# Patient Record
Sex: Female | Born: 1955 | Race: White | Hispanic: No | Marital: Married | Smoking: Never smoker
Health system: Southern US, Community
[De-identification: ages and names within clinical notes are randomized; demographics above are authoritative.]

## PROBLEM LIST (undated history)

## (undated) DIAGNOSIS — M1990 Unspecified osteoarthritis, unspecified site: Secondary | ICD-10-CM

## (undated) DIAGNOSIS — K219 Gastro-esophageal reflux disease without esophagitis: Secondary | ICD-10-CM

## (undated) DIAGNOSIS — M353 Polymyalgia rheumatica: Secondary | ICD-10-CM

## (undated) DIAGNOSIS — E7800 Pure hypercholesterolemia, unspecified: Secondary | ICD-10-CM

## (undated) DIAGNOSIS — G4730 Sleep apnea, unspecified: Secondary | ICD-10-CM

## (undated) DIAGNOSIS — E079 Disorder of thyroid, unspecified: Secondary | ICD-10-CM

## (undated) DIAGNOSIS — E039 Hypothyroidism, unspecified: Secondary | ICD-10-CM

## (undated) DIAGNOSIS — I10 Essential (primary) hypertension: Secondary | ICD-10-CM

## (undated) DIAGNOSIS — M1120 Other chondrocalcinosis, unspecified site: Secondary | ICD-10-CM

## (undated) DIAGNOSIS — B019 Varicella without complication: Secondary | ICD-10-CM

## (undated) DIAGNOSIS — T7840XA Allergy, unspecified, initial encounter: Secondary | ICD-10-CM

## (undated) DIAGNOSIS — B2790 Infectious mononucleosis, unspecified without complication: Secondary | ICD-10-CM

## (undated) HISTORY — PX: COLONOSCOPY: SHX174

## (undated) HISTORY — DX: Gastro-esophageal reflux disease without esophagitis: K21.9

## (undated) HISTORY — PX: JOINT REPLACEMENT: SHX530

## (undated) HISTORY — PX: KNEE SURGERY: SHX244

## (undated) HISTORY — PX: OVARIAN CYST SURGERY: SHX726

## (undated) HISTORY — DX: Disorder of thyroid, unspecified: E07.9

## (undated) HISTORY — DX: Allergy, unspecified, initial encounter: T78.40XA

---

## 1981-11-28 HISTORY — PX: ILIOTIBIAL BAND RELEASE: SHX675

## 1981-11-28 HISTORY — PX: OVARIAN CYST SURGERY: SHX726

## 1990-11-28 HISTORY — PX: OSTEOTOMY: SHX137

## 2002-11-28 HISTORY — PX: REPLACEMENT TOTAL KNEE: SUR1224

## 2002-12-29 HISTORY — PX: ABDOMINAL HYSTERECTOMY: SHX81

## 2002-12-29 HISTORY — PX: VAGINAL HYSTERECTOMY: SHX2639

## 2004-11-08 ENCOUNTER — Inpatient Hospital Stay: Payer: Self-pay | Admitting: General Practice

## 2004-11-08 HISTORY — PX: TOTAL KNEE ARTHROPLASTY: SHX125

## 2004-11-19 ENCOUNTER — Ambulatory Visit: Payer: Self-pay | Admitting: Family Medicine

## 2004-12-01 ENCOUNTER — Encounter: Payer: Self-pay | Admitting: General Practice

## 2004-12-01 ENCOUNTER — Emergency Department: Payer: Self-pay | Admitting: General Practice

## 2004-12-29 ENCOUNTER — Encounter: Payer: Self-pay | Admitting: General Practice

## 2005-01-26 ENCOUNTER — Encounter: Payer: Self-pay | Admitting: General Practice

## 2005-02-26 ENCOUNTER — Encounter: Payer: Self-pay | Admitting: General Practice

## 2006-11-09 ENCOUNTER — Ambulatory Visit: Payer: Self-pay | Admitting: Family Medicine

## 2008-02-14 ENCOUNTER — Ambulatory Visit: Payer: Self-pay | Admitting: Family Medicine

## 2008-05-23 ENCOUNTER — Ambulatory Visit: Payer: Self-pay | Admitting: Unknown Physician Specialty

## 2008-06-17 ENCOUNTER — Ambulatory Visit: Payer: Self-pay | Admitting: Unknown Physician Specialty

## 2008-06-20 ENCOUNTER — Ambulatory Visit: Payer: Self-pay | Admitting: Unknown Physician Specialty

## 2008-07-02 ENCOUNTER — Ambulatory Visit: Payer: Self-pay | Admitting: Unknown Physician Specialty

## 2009-01-21 ENCOUNTER — Ambulatory Visit: Payer: Self-pay | Admitting: Family Medicine

## 2009-06-25 ENCOUNTER — Ambulatory Visit: Payer: Self-pay | Admitting: Unknown Physician Specialty

## 2009-06-28 DIAGNOSIS — B2790 Infectious mononucleosis, unspecified without complication: Secondary | ICD-10-CM | POA: Insufficient documentation

## 2010-01-12 ENCOUNTER — Ambulatory Visit: Payer: Self-pay | Admitting: Unknown Physician Specialty

## 2011-08-18 ENCOUNTER — Ambulatory Visit: Payer: Self-pay | Admitting: Sports Medicine

## 2011-09-16 ENCOUNTER — Ambulatory Visit: Payer: Self-pay | Admitting: General Practice

## 2012-12-20 ENCOUNTER — Ambulatory Visit: Payer: Self-pay | Admitting: Unknown Physician Specialty

## 2013-09-16 ENCOUNTER — Ambulatory Visit: Payer: Self-pay | Admitting: Family Medicine

## 2013-10-11 ENCOUNTER — Ambulatory Visit: Payer: Self-pay | Admitting: Gastroenterology

## 2014-09-19 ENCOUNTER — Ambulatory Visit: Payer: Self-pay | Admitting: Family Medicine

## 2015-04-08 ENCOUNTER — Encounter: Payer: Self-pay | Admitting: Podiatry

## 2015-04-08 ENCOUNTER — Ambulatory Visit (INDEPENDENT_AMBULATORY_CARE_PROVIDER_SITE_OTHER): Payer: BLUE CROSS/BLUE SHIELD | Admitting: Podiatry

## 2015-04-08 VITALS — BP 134/89 | HR 93 | Resp 16 | Ht 66.0 in | Wt 205.0 lb

## 2015-04-08 DIAGNOSIS — M1180 Other specified crystal arthropathies, unspecified site: Secondary | ICD-10-CM | POA: Insufficient documentation

## 2015-04-08 DIAGNOSIS — B019 Varicella without complication: Secondary | ICD-10-CM | POA: Insufficient documentation

## 2015-04-08 DIAGNOSIS — M722 Plantar fascial fibromatosis: Secondary | ICD-10-CM | POA: Diagnosis not present

## 2015-04-08 DIAGNOSIS — M1990 Unspecified osteoarthritis, unspecified site: Secondary | ICD-10-CM | POA: Insufficient documentation

## 2015-04-08 HISTORY — DX: Varicella without complication: B01.9

## 2015-04-09 NOTE — Progress Notes (Signed)
She presents today for chief complaint of occasionally painful feet associated with her orthotics. She states that her orthotics are older and starting to breakdown causes pain. She denies any changes in her past medical history medications allergies surgeries or social history.  Objective: Vital signs are stable she is alert and oriented 3. Pulses are palpable bilateral. Orthopedic evaluationrectus foot type with mild tenderness on palpation of the Achilles and the plantar fashion bilateral otherwise no erythema or edema saline drainage or odor. All muscle groups are intact and neurologic sensorium is intact.  Assessment: Rectus foot type bilateral history of plantar fasciitis.  Plan: Scanned for new set of orthotics. These will help prevent fasciitis recurrence.

## 2015-05-11 ENCOUNTER — Telehealth: Payer: Self-pay | Admitting: Family Medicine

## 2015-05-11 DIAGNOSIS — E039 Hypothyroidism, unspecified: Secondary | ICD-10-CM

## 2015-05-11 NOTE — Telephone Encounter (Signed)
TSH for hypothyroid. Thanks.

## 2015-05-11 NOTE — Telephone Encounter (Signed)
Pt stated she needs a lab slip and would like to be called when it is ready to be picked up. Thanks TNP

## 2015-05-11 NOTE — Telephone Encounter (Signed)
Patient advised that lab slip is ready.

## 2015-05-13 ENCOUNTER — Telehealth: Payer: Self-pay

## 2015-05-13 NOTE — Telephone Encounter (Signed)
Called to let pt know that there orthotics were in and ready for pick up .

## 2015-05-14 LAB — TSH: TSH: 1.66 u[IU]/mL (ref 0.450–4.500)

## 2015-05-15 ENCOUNTER — Encounter: Payer: Self-pay | Admitting: Family Medicine

## 2015-05-15 ENCOUNTER — Ambulatory Visit (INDEPENDENT_AMBULATORY_CARE_PROVIDER_SITE_OTHER): Payer: BLUE CROSS/BLUE SHIELD | Admitting: Family Medicine

## 2015-05-15 VITALS — BP 112/66 | HR 91 | Temp 98.1°F | Resp 16 | Ht 66.0 in | Wt 210.0 lb

## 2015-05-15 DIAGNOSIS — M109 Gout, unspecified: Secondary | ICD-10-CM | POA: Insufficient documentation

## 2015-05-15 DIAGNOSIS — E069 Thyroiditis, unspecified: Secondary | ICD-10-CM | POA: Insufficient documentation

## 2015-05-15 DIAGNOSIS — K219 Gastro-esophageal reflux disease without esophagitis: Secondary | ICD-10-CM

## 2015-05-15 DIAGNOSIS — R5383 Other fatigue: Secondary | ICD-10-CM | POA: Diagnosis not present

## 2015-05-15 DIAGNOSIS — G4730 Sleep apnea, unspecified: Secondary | ICD-10-CM

## 2015-05-15 DIAGNOSIS — H9190 Unspecified hearing loss, unspecified ear: Secondary | ICD-10-CM | POA: Insufficient documentation

## 2015-05-15 DIAGNOSIS — R0789 Other chest pain: Secondary | ICD-10-CM | POA: Diagnosis not present

## 2015-05-15 DIAGNOSIS — L299 Pruritus, unspecified: Secondary | ICD-10-CM | POA: Insufficient documentation

## 2015-05-15 DIAGNOSIS — E282 Polycystic ovarian syndrome: Secondary | ICD-10-CM | POA: Insufficient documentation

## 2015-05-15 DIAGNOSIS — E7800 Pure hypercholesterolemia, unspecified: Secondary | ICD-10-CM | POA: Insufficient documentation

## 2015-05-15 DIAGNOSIS — E559 Vitamin D deficiency, unspecified: Secondary | ICD-10-CM

## 2015-05-15 DIAGNOSIS — L308 Other specified dermatitis: Secondary | ICD-10-CM | POA: Insufficient documentation

## 2015-05-15 DIAGNOSIS — M1990 Unspecified osteoarthritis, unspecified site: Secondary | ICD-10-CM | POA: Insufficient documentation

## 2015-05-15 DIAGNOSIS — N809 Endometriosis, unspecified: Secondary | ICD-10-CM | POA: Insufficient documentation

## 2015-05-15 DIAGNOSIS — E039 Hypothyroidism, unspecified: Secondary | ICD-10-CM | POA: Insufficient documentation

## 2015-05-15 NOTE — Progress Notes (Signed)
Subjective:    Patient ID: Alexis Newman, female    DOB: March 06, 1956, 59 y.o.   MRN: [id number]  Gastrophageal Reflux She complains of chest pain and coughing. She reports no abdominal pain, no choking, no heartburn or no sore throat. This is a chronic problem. The problem occurs constantly. The problem has been gradually worsening. The symptoms are aggravated by certain foods. Associated symptoms include fatigue. She has tried an antacid for the symptoms. The treatment provided mild relief.  Patient reports that she takes Prilosec for daily for 2 weeks and notices an improvement on symptoms. Patient reports that as soon as she stops the medication her symptoms worsen.   Fatigue Patient reports that she has been fatigued for months. Patient reports that she just came back from 3 weeks of vacation and still feels very tired. Is using CPAP regularly.  Wakes if does not take tylenol PM.  Has had night sweat also.    Review of Systems  Constitutional: Positive for fatigue.  HENT: Negative for sore throat.   Respiratory: Positive for cough. Negative for choking.   Cardiovascular: Positive for chest pain.  Gastrointestinal: Negative for heartburn and abdominal pain.   Patient Active Problem List   Diagnosis Date Noted  . Itch of skin 05/15/2015  . Endometriosis 05/15/2015  . Acid reflux 05/15/2015  . Gout 05/15/2015  . Difficulty hearing 05/15/2015  . Osteoarthrosis 05/15/2015  . Polycystic ovaries 05/15/2015  . Hypercholesterolemia without hypertriglyceridemia 05/15/2015  . Apnea, sleep 05/15/2015  . Thyroiditis 05/15/2015  . Avitaminosis D 05/15/2015  . Arthritis 04/08/2015  . Plantar fasciitis 04/08/2015  . Chicken pox 04/08/2015  . 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 Mononucleosis 06/28/2009   Past Medical History  Diagnosis Date  . Allergy   . Thyroid disease   . GERD (gastroesophageal reflux disease)    Current Outpatient Prescriptions on File  Prior to Visit  Medication Sig  . SYNTHROID 112 MCG tablet    No current facility-administered medications on file prior to visit.   No Known Allergies Past Surgical History  Procedure Laterality Date  . Replacement total knee Left 2004  . Knee surgery Bilateral     Multiple surgies bilaterally.   . Abdominal hysterectomy  12/2002    Still has cervix. Due to endometriosis  . Iliotibial band release  1983    Iliotibial extraaticcular reconstruction   . Osteotomy  1992    Partial osteotomy  . Ovarian cyst surgery  1978 & 1983   History   Social History  . Marital Status: Married    Spouse Name: Pieter Partridge  . Number of Children: 0  . Years of Education: Ph.D.   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 Smoking status: Never Smoker   . Smokeless tobacco: Never Used  . Alcohol Use: 1.2 oz/week    0 Standard drinks or equivalent, 2 Cans of beer per week     Comment: Occasionally  . Drug Use: No  . Sexual Activity: Not on file   Other Topics Concern  . Not on file   Social History Narrative   Family History  Problem Relation Age of Onset  . Diabetes Mother   . Glaucoma Mother   . Stroke Father   . Hypertension Father   . Skin cancer Father   . Dementia Father   . Healthy Sister   . Hypertension Brother   . Diabetes Brother   . Stroke  Maternal Grandfather         Objective:   Physical Exam  Constitutional: She is oriented to person, place, and time. She appears well-developed and well-nourished.  Cardiovascular: Normal rate and regular rhythm.   Pulmonary/Chest: Effort normal and breath sounds normal.  Neurological: She is alert and oriented to person, place, and time.  Psychiatric: She has a normal mood and affect. Her behavior is normal. Judgment and thought content normal.    Blood pressure 112/66, pulse 91, temperature 98.1 F (36.7 C), temperature source Oral, resp. rate 16, height 5\' 6"  (1.676 m), weight 210 lb  (95.255 kg), SpO2 97 %.       Assessment & Plan:   1. Gastroesophageal reflux disease, esophagitis presence not specified Worsening. Continue Prilosec. Will refer.  - Ambulatory referral to Gastroenterology  2. Other fatigue Unclear etiology.  Check labs.  - TSH - Vitamin B12 - Comprehensive metabolic panel  3. Avitaminosis D Taking meds, will check labs.  - Vit D  25 hydroxy (rtn osteoporosis monitoring)  4. Apnea, sleep Tolerating if takes  Tylenol PM. Will consider Auto titration if another source of fatigue can not be found.   5. Atypical chest pain Ekg WNL. Will refer.  - EKG 12-Lead - CBC with Differential/Platelet - Ambulatory referral to Cardiology  Lorie Phenix, MD

## 2015-05-19 LAB — CBC WITH DIFFERENTIAL/PLATELET
Basophils Absolute: 0 10*3/uL (ref 0.0–0.2)
Basos: 1 %
EOS (ABSOLUTE): 0.1 10*3/uL (ref 0.0–0.4)
EOS: 2 %
HEMATOCRIT: 38.4 % (ref 34.0–46.6)
HEMOGLOBIN: 12.8 g/dL (ref 11.1–15.9)
Immature Grans (Abs): 0 10*3/uL (ref 0.0–0.1)
Immature Granulocytes: 0 %
LYMPHS ABS: 1.8 10*3/uL (ref 0.7–3.1)
Lymphs: 23 %
MCH: 28.9 pg (ref 26.6–33.0)
MCHC: 33.3 g/dL (ref 31.5–35.7)
MCV: 87 fL (ref 79–97)
MONOS ABS: 0.5 10*3/uL (ref 0.1–0.9)
Monocytes: 7 %
NEUTROS ABS: 5.3 10*3/uL (ref 1.4–7.0)
Neutrophils: 67 %
Platelets: 269 10*3/uL (ref 150–379)
RBC: 4.43 x10E6/uL (ref 3.77–5.28)
RDW: 13.5 % (ref 12.3–15.4)
WBC: 7.8 10*3/uL (ref 3.4–10.8)

## 2015-05-19 LAB — VITAMIN D 25 HYDROXY (VIT D DEFICIENCY, FRACTURES): Vit D, 25-Hydroxy: 38.3 ng/mL (ref 30.0–100.0)

## 2015-05-19 LAB — COMPREHENSIVE METABOLIC PANEL
A/G RATIO: 1.7 (ref 1.1–2.5)
ALT: 30 IU/L (ref 0–32)
AST: 25 IU/L (ref 0–40)
Albumin: 4.3 g/dL (ref 3.5–5.5)
Alkaline Phosphatase: 67 IU/L (ref 39–117)
BILIRUBIN TOTAL: 0.2 mg/dL (ref 0.0–1.2)
BUN / CREAT RATIO: 20 (ref 9–23)
BUN: 15 mg/dL (ref 6–24)
CHLORIDE: 98 mmol/L (ref 97–108)
CO2: 24 mmol/L (ref 18–29)
Calcium: 9.4 mg/dL (ref 8.7–10.2)
Creatinine, Ser: 0.75 mg/dL (ref 0.57–1.00)
GFR calc Af Amer: 101 mL/min/{1.73_m2} (ref >59)
GFR, EST NON AFRICAN AMERICAN: 88 mL/min/{1.73_m2} (ref >59)
Globulin, Total: 2.6 g/dL (ref 1.5–4.5)
Glucose: 103 mg/dL — ABNORMAL HIGH (ref 65–99)
Potassium: 4.7 mmol/L (ref 3.5–5.2)
Sodium: 140 mmol/L (ref 134–144)
TOTAL PROTEIN: 6.9 g/dL (ref 6.0–8.5)

## 2015-05-19 LAB — TSH: TSH: 1.4 u[IU]/mL (ref 0.450–4.500)

## 2015-05-19 LAB — VITAMIN B12: Vitamin B-12: 391 pg/mL (ref 211–946)

## 2015-05-22 ENCOUNTER — Telehealth: Payer: Self-pay

## 2015-05-22 NOTE — Telephone Encounter (Signed)
Pt advised as directed below.  She says that we was not fasting for to the blood work, and that is probably why her sugar was up some.   Thanks,   -Vernona Rieger

## 2015-05-22 NOTE — Telephone Encounter (Signed)
-----   Message from Lorie Phenix, MD sent at 05/20/2015  8:50 PM EDT ----- Labs stable except blood sugar slightly higher than should be. Make sure to eat healthy and exercise and recheck in 6 months for stability. Please notify patient. Thanks.

## 2015-07-06 ENCOUNTER — Encounter (INDEPENDENT_AMBULATORY_CARE_PROVIDER_SITE_OTHER): Payer: Self-pay

## 2015-07-06 ENCOUNTER — Encounter: Payer: Self-pay | Admitting: Cardiovascular Disease

## 2015-07-06 ENCOUNTER — Ambulatory Visit (INDEPENDENT_AMBULATORY_CARE_PROVIDER_SITE_OTHER): Payer: BLUE CROSS/BLUE SHIELD | Admitting: Cardiovascular Disease

## 2015-07-06 VITALS — BP 158/92 | HR 71 | Ht 66.0 in | Wt 206.2 lb

## 2015-07-06 DIAGNOSIS — R002 Palpitations: Secondary | ICD-10-CM | POA: Insufficient documentation

## 2015-07-06 DIAGNOSIS — R5383 Other fatigue: Secondary | ICD-10-CM | POA: Insufficient documentation

## 2015-07-06 DIAGNOSIS — R5382 Chronic fatigue, unspecified: Secondary | ICD-10-CM | POA: Diagnosis not present

## 2015-07-06 DIAGNOSIS — G4730 Sleep apnea, unspecified: Secondary | ICD-10-CM

## 2015-07-06 DIAGNOSIS — R0602 Shortness of breath: Secondary | ICD-10-CM | POA: Diagnosis not present

## 2015-07-06 DIAGNOSIS — E7800 Pure hypercholesterolemia, unspecified: Secondary | ICD-10-CM

## 2015-07-06 NOTE — Patient Instructions (Addendum)
You are doing well. No medication changes were made.  We will order a CT coronary calcium score for fatigue Wednesday, Aug 10 @ 3:30 Please arrive @ 3:15 There is a one time fee of $150.00 due at the time of your procedure  Consider trazodone for sleep  Please call us if you have new issues that need to be addressed before your next appt.   CT Scan A computed tomography (CT) scan is a specialized X-ray scan. It uses X-rays and a computer to make pictures of different areas of your body. A CT scan can offer more detailed information than a regular X-ray exam. The CT scan provides data about internal organs, soft tissue structures, blood vessels, and bones.  The CT scanner is a large machine that takes pictures of your body as you move through the opening.  LET [facility] CARE PROVIDER KNOW ABOUT:  Any allergies you have.   All medicines you are taking, including vitamins, herbs, eye drops, creams, and over-the-counter medicines.   Previous problems you or members of your family have had with the use of anesthetics.   Any blood disorders you have.   Previous surgeries you have had.   Medical conditions you have. RISKS AND COMPLICATIONS  Generally, this is a safe procedure. However, as with any procedure, problems can occur. Possible problems include:   An allergic reaction to the contrast material.   Development of cancer from excessive exposure to radiation. The risk of this is small.  BEFORE THE PROCEDURE   The day before the test, stop drinking caffeinated beverages. These include energy drinks, tea, soda, coffee, and hot chocolate.   On the day of the test:  About 4 hours before the test, stop eating and drinking anything but water as advised by your health care provider.   Avoid wearing jewelry. You will have to partly or fully undress and wear a hospital gown. PROCEDURE   You will be asked to lie on a table with your arms above your head.   If contrast  dye is to be used for the test, an IV tube will be inserted in your arm. The contrast dye will be injected into the IV tube. You might feel warm, or you may get a metallic taste in your mouth.   The table you will be lying on will move into a large machine that will do the scanning.   You will be able to see, hear, and talk to the person running the machine while you are in it. Follow that person's directions.   The CT machine will move around you to take pictures. Do not move while it is scanning. This helps to get a good image.   When the best possible pictures have been taken, the machine will be turned off. The table will be moved out of the machine. The IV tube will then be removed. AFTER THE PROCEDURE  Ask your health care provider when to follow up for your test results. Document Released: 12/22/2004 Document Revised: 11/19/2013 Document Reviewed: 07/22/2013 [facility] Patient Information 2015 [place], [place]. This information is not intended to replace advice given to you by your health care provider. Make sure you discuss any questions you have with your health care provider.

## 2015-07-06 NOTE — Assessment & Plan Note (Signed)
2 episodes of palpitations, 1 in October 2015, again March 2016 lasting for less than 1 minute. Recommended if she has recurrent symptoms that she call our office. 30 day monitor could be ordered if she is symptomatic.

## 2015-07-06 NOTE — Progress Notes (Addendum)
Patient ID: Alexis Newman, female    DOB: 07/18/1956, 59 y.o.   MRN: [id number]  HPI Comments: Ms. 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  Etiology of her fatigue is unclear. Symptoms for the past year at least. She does not believe this is secondary to a underlying cardiac issue. She has poor sleep hygiene, takes Tylenol PM before bed which allows her to sleep for approximately 5 hours than she is awake. She is scheduled to see ear nose throat to adjust her CPAP. She does report buying a travel unit with auto titration but she has not tried this yet. In the morning she wakes up feeling tired. She is able to walk her dog 2 times per day for 30 minutes. No regular exercise program.  Prior to being on CPAP she had chronic headaches.  2 episodes of palpitations lasting for 30 minutes up to 1 minute in October 2015 and again March 2016. No further episodes since that time. Episodes occurred at rest, while at work, same time of day.  Otherwise active, she reports that she tries to take the stairs, does other activities typically with no symptoms.  Blood pressure elevated today but typically this is very well controlled No smoking history  Lab work reviewed with her from April 2016 showing total cholesterol 197, LDL 118, HDL 49  EKG on today's visit shows normal sinus rhythm with rate 71 bpm, no significant ST or T-wave changes    No Known Allergies  Current Outpatient Prescriptions on File Prior to Visit  Medication Sig Dispense Refill  . acetaminophen (TYLENOL) 500 MG tablet Take 500 mg by mouth every 6 (six) hours as needed.    . cholecalciferol (VITAMIN D) 1000 UNITS tablet Take by mouth.    Marland Kitchen omeprazole (PRILOSEC OTC) 20 MG tablet Take 20 mg by mouth daily.    Marland Kitchen SYNTHROID 112 MCG tablet Take 112 mcg by mouth daily  before breakfast.   0   No current facility-administered medications on file prior to visit.    Past Medical History  Diagnosis Date  . Allergy   . Thyroid disease   . GERD (gastroesophageal reflux disease)     Past Surgical History  Procedure Laterality Date  . Replacement total knee Left 2004  . Knee surgery Bilateral     Multiple surgies bilaterally.   . Abdominal hysterectomy  12/2002    Still has cervix. Due to endometriosis  . Iliotibial band release  1983    Iliotibial extraaticcular reconstruction   . Osteotomy  1992    Partial osteotomy  . Ovarian cyst surgery  1978 & 1983    Social History  reports that she has never smoked. She has never used smokeless tobacco. She reports that she drinks about 1.2 oz of alcohol per week. She reports that she does not use illicit drugs.  Family History family history includes Dementia in her father; Diabetes in her brother and mother; Glaucoma in her mother; Healthy in her sister; Hypertension in her brother and father; Skin cancer in her father; Stroke in her father and maternal grandfather.      Review of Systems  Constitutional: Negative.   Respiratory: Negative.   Cardiovascular: Negative.   Gastrointestinal: Negative.   Musculoskeletal: Negative.   Neurological: Negative.   Hematological: Negative.   Psychiatric/Behavioral: Negative.  BP 158/92 mmHg  Pulse 71  Ht 5\' 6"  (1.676 m)  Wt 206 lb 4 oz (93.554 kg)  BMI 33.31 kg/m2  Physical Exam  Constitutional: She is oriented to person, place, and time. She appears well-developed and well-nourished.  HENT:  Head: Normocephalic.  Nose: Nose normal.  Mouth/Throat: Oropharynx is clear and moist.  Eyes: Conjunctivae are normal. Pupils are equal, round, and reactive to light.  Neck: Normal range of motion. Neck supple. No JVD present.  Cardiovascular: Normal rate, regular rhythm, normal heart sounds and intact distal pulses.  Exam reveals no gallop and no friction  rub.   No murmur heard. Pulmonary/Chest: Effort normal and breath sounds normal. No respiratory distress. She has no wheezes. She has no rales. She exhibits no tenderness.  Abdominal: Soft. Bowel sounds are normal. She exhibits no distension. There is no tenderness.  Musculoskeletal: Normal range of motion. She exhibits no edema or tenderness.  Lymphadenopathy:    She has no cervical adenopathy.  Neurological: She is alert and oriented to person, place, and time. Coordination normal.  Skin: Skin is warm and dry. No rash noted. No erythema.  Psychiatric: She has a normal mood and affect. Her behavior is normal. Judgment and thought content normal.

## 2015-07-06 NOTE — Assessment & Plan Note (Signed)
Chronic fatigue, unable to exclude poor sleep hygiene as a cause of her symptoms. Typically wakes up after 5 hours despite taking Tylenol p.m.Alexis Newman She has tried other sleep aids in the past such as Ambien and did not like the feeling. Other options for sleep assistance include melatonin. Possibly even trazodone. She also may do better with a regular exercise program. This might help with sleep

## 2015-07-06 NOTE — Assessment & Plan Note (Signed)
She reports that she has follow-up with ENT for her CPAP settings. Certainly possible left she needs auto titration. Unclear body weight has changed.

## 2015-07-06 NOTE — Assessment & Plan Note (Signed)
Cholesterol reviewed with her. For risk stratification, particularly in the setting of her fatigue, CT coronary calcium score has been ordered. Recommended she size with weight loss

## 2015-07-08 ENCOUNTER — Ambulatory Visit (INDEPENDENT_AMBULATORY_CARE_PROVIDER_SITE_OTHER)
Admission: RE | Admit: 2015-07-08 | Discharge: 2015-07-08 | Disposition: A | Discharge status: Home / Self Care | Payer: BLUE CROSS/BLUE SHIELD | Source: Ambulatory Visit | Attending: Cardiovascular Disease | Admitting: Cardiovascular Disease

## 2015-07-08 DIAGNOSIS — R5383 Other fatigue: Secondary | ICD-10-CM

## 2015-07-08 DIAGNOSIS — R002 Palpitations: Secondary | ICD-10-CM

## 2015-07-08 DIAGNOSIS — R0602 Shortness of breath: Secondary | ICD-10-CM

## 2015-07-30 ENCOUNTER — Encounter: Payer: Self-pay | Admitting: *Deleted

## 2015-07-31 ENCOUNTER — Ambulatory Visit
Admission: RE | Admit: 2015-07-31 | Discharge: 2015-07-31 | Disposition: A | Discharge status: Home / Self Care | Payer: BLUE CROSS/BLUE SHIELD | Source: Ambulatory Visit | Attending: Gastroenterology | Admitting: Gastroenterology

## 2015-07-31 ENCOUNTER — Encounter
Admission: RE | Disposition: A | Discharge status: Home / Self Care | Payer: Self-pay | Source: Ambulatory Visit | Attending: Gastroenterology

## 2015-07-31 ENCOUNTER — Ambulatory Visit: Payer: BLUE CROSS/BLUE SHIELD | Admitting: Anesthesiology

## 2015-07-31 DIAGNOSIS — Z8249 Family history of ischemic heart disease and other diseases of the circulatory system: Secondary | ICD-10-CM | POA: Insufficient documentation

## 2015-07-31 DIAGNOSIS — Z90711 Acquired absence of uterus with remaining cervical stump: Secondary | ICD-10-CM | POA: Insufficient documentation

## 2015-07-31 DIAGNOSIS — Z83511 Family history of glaucoma: Secondary | ICD-10-CM | POA: Insufficient documentation

## 2015-07-31 DIAGNOSIS — Z96652 Presence of left artificial knee joint: Secondary | ICD-10-CM | POA: Insufficient documentation

## 2015-07-31 DIAGNOSIS — Z79899 Other long term (current) drug therapy: Secondary | ICD-10-CM | POA: Insufficient documentation

## 2015-07-31 DIAGNOSIS — Z823 Family history of stroke: Secondary | ICD-10-CM | POA: Diagnosis not present

## 2015-07-31 DIAGNOSIS — Z808 Family history of malignant neoplasm of other organs or systems: Secondary | ICD-10-CM | POA: Insufficient documentation

## 2015-07-31 DIAGNOSIS — Z9889 Other specified postprocedural states: Secondary | ICD-10-CM | POA: Insufficient documentation

## 2015-07-31 DIAGNOSIS — E079 Disorder of thyroid, unspecified: Secondary | ICD-10-CM | POA: Insufficient documentation

## 2015-07-31 DIAGNOSIS — Z833 Family history of diabetes mellitus: Secondary | ICD-10-CM | POA: Insufficient documentation

## 2015-07-31 DIAGNOSIS — M1990 Unspecified osteoarthritis, unspecified site: Secondary | ICD-10-CM | POA: Diagnosis not present

## 2015-07-31 DIAGNOSIS — K219 Gastro-esophageal reflux disease without esophagitis: Secondary | ICD-10-CM | POA: Diagnosis not present

## 2015-07-31 DIAGNOSIS — Z818 Family history of other mental and behavioral disorders: Secondary | ICD-10-CM | POA: Insufficient documentation

## 2015-07-31 HISTORY — DX: Varicella without complication: B01.9

## 2015-07-31 HISTORY — DX: Unspecified osteoarthritis, unspecified site: M19.90

## 2015-07-31 HISTORY — DX: Infectious mononucleosis, unspecified without complication: B27.90

## 2015-07-31 HISTORY — DX: Sleep apnea, unspecified: G47.30

## 2015-07-31 HISTORY — PX: ESOPHAGOGASTRODUODENOSCOPY (EGD) WITH PROPOFOL: SHX5813

## 2015-07-31 HISTORY — DX: Other chondrocalcinosis, unspecified site: M11.20

## 2015-07-31 HISTORY — DX: Hypothyroidism, unspecified: E03.9

## 2015-07-31 SURGERY — ESOPHAGOGASTRODUODENOSCOPY (EGD) WITH PROPOFOL
Anesthesia: General

## 2015-07-31 MED ORDER — PROPOFOL 10 MG/ML IV BOLUS
INTRAVENOUS | Status: DC | PRN
  Administered 2015-07-31: 40 mg via INTRAVENOUS

## 2015-07-31 MED ORDER — PROPOFOL INFUSION 10 MG/ML OPTIME
INTRAVENOUS | Status: DC | PRN
  Administered 2015-07-31: 40 ug/kg/min via INTRAVENOUS
  Administered 2015-07-31: 30 ug/kg/min via INTRAVENOUS

## 2015-07-31 MED ORDER — SODIUM CHLORIDE 0.9 % IV SOLN
INTRAVENOUS | Status: DC
  Administered 2015-07-31: 1000 mL via INTRAVENOUS
  Administered 2015-07-31: 10:00:00 via INTRAVENOUS

## 2015-07-31 MED ORDER — MIDAZOLAM HCL 2 MG/2ML IJ SOLN
INTRAMUSCULAR | Status: DC | PRN
  Administered 2015-07-31: 1 mg via INTRAVENOUS

## 2015-07-31 NOTE — Transfer of Care (Signed)
Immediate Anesthesia Transfer of Care Note  Patient: Alexis Newman  Procedure(s) Performed: Procedure(s): ESOPHAGOGASTRODUODENOSCOPY (EGD) WITH PROPOFOL (N/A)  Patient Location: PACU and Endoscopy Unit  Anesthesia Type:General  Level of Consciousness: sedated  Airway & Oxygen Therapy: Patient Spontanous Breathing and Patient connected to nasal cannula oxygen  Post-op Assessment: Report given to RN and Post -op Vital signs reviewed and stable  Post vital signs: stable  Last Vitals:  Filed Vitals:   07/31/15 0917  BP: 144/79  Pulse: 68  Temp: 36.8 C  Resp: 20    Complications: No apparent anesthesia complications

## 2015-07-31 NOTE — H&P (Signed)
    Primary Care Physician:  Lorie Phenix, MD Primary Gastroenterologist:  Dr. Bluford Kaufmann  Pre-Procedure History & Physical: HPI:  Alexis Newman is a 59 y.o. female is here for an EGD.   Past Medical History  Diagnosis Date  . Allergy   . Thyroid disease   . GERD (gastroesophageal reflux disease)   . Chickenpox   . Pseudogout   . Arthritis   . Mononucleosis     Past Surgical History  Procedure Laterality Date  . Replacement total knee Left 2004  . Knee surgery Bilateral     Multiple surgies bilaterally.   . Abdominal hysterectomy  12/2002    Still has cervix. Due to endometriosis  . Iliotibial band release  1983    Iliotibial extraaticcular reconstruction   . Osteotomy  1992    Partial osteotomy  . Ovarian cyst surgery  1978 & 1983    Prior to Admission medications   Medication Sig Start Date End Date Taking? Authorizing Provider  omeprazole (PRILOSEC) 40 MG capsule Take 40 mg by mouth daily.   Yes Historical Provider, MD  acetaminophen (TYLENOL) 500 MG tablet Take 500 mg by mouth every 6 (six) hours as needed.    Historical Provider, MD  cholecalciferol (VITAMIN D) 1000 UNITS tablet Take by mouth.    Historical Provider, MD  SYNTHROID 112 MCG tablet Take 112 mcg by mouth daily before breakfast.  03/24/15   Historical Provider, MD    Allergies as of 06/02/2015  . (No Known Allergies)    Family History  Problem Relation Age of Onset  . Diabetes Mother   . Glaucoma Mother   . Stroke Father   . Hypertension Father   . Skin cancer Father   . Dementia Father   . Healthy Sister   . Hypertension Brother   . Diabetes Brother   . Stroke Maternal Grandfather     Social History   Social History  . Marital Status: Married    Spouse Name: Pieter Partridge  . Number of Children: 0  . Years of Education: Ph.D.   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 Smoking status: Never Smoker   . Smokeless tobacco: Never Used  . Alcohol Use: 1.2  oz/week    2 Cans of beer, 0 Standard drinks or equivalent per week     Comment: Occasionally  . Drug Use: No  . Sexual Activity: Not on file   Other Topics Concern  . Not on file   Social History Narrative    Review of Systems: See HPI, otherwise negative ROS  Physical Exam: There were no vitals taken for this visit. General:   Alert,  pleasant and cooperative in NAD Head:  Normocephalic and atraumatic. Neck:  Supple; no masses or thyromegaly. Lungs:  Clear throughout to auscultation.    Heart:  Regular rate and rhythm. Abdomen:  Soft, nontender and nondistended. Normal bowel sounds, without guarding, and without rebound.   Neurologic:  Alert and  oriented x4;  grossly normal neurologically.  Impression/Plan: Alexis Newman is here for an EGD to be performed for GERD.  Risks, benefits, limitations, and alternatives regarding EGD have been reviewed with the patient.  Questions have been answered.  All parties agreeable.   Hovanes Hymas, Ezzard Standing, MD  07/31/2015, 9:13 AM

## 2015-07-31 NOTE — Op Note (Signed)
[facility] Gastroenterology Patient Name: Alexis Newman Procedure Date: 07/31/2015 9:52 AM MRN: [id number] Account #: [id number] Date of Birth: [birth year] Admit Type: Outpatient Age: 59 Room: [facility] ENDO ROOM 4 Gender: Female Note Status: Finalized Procedure:         Upper GI endoscopy Indications:       Suspected gastro-esophageal reflux disease Providers:         Ezzard Standing. Bluford Kaufmann, MD Referring MD:      Erma Heritage. Maclaurin (Referring MD) Medicines:         Monitored Anesthesia Care Complications:     No immediate complications. Procedure:         Pre-Anesthesia Assessment:                    - Prior to the procedure, a History and Physical was                     performed, and patient medications, allergies and                     sensitivities were reviewed. The patient's tolerance of                     previous anesthesia was reviewed.                    - The risks and benefits of the procedure and the sedation                     options and risks were discussed with the patient. All                     questions were answered and informed consent was obtained.                    - After reviewing the risks and benefits, the patient was                     deemed in satisfactory condition to undergo the procedure.                    After obtaining informed consent, the endoscope was passed                     under direct vision. Throughout the procedure, the                     patient's blood pressure, pulse, and oxygen saturations                     were monitored continuously. The Olympus GIF-160 endoscope                     (S#. [id number]) was introduced through the mouth, and                     advanced to the second part of duodenum. The upper GI                     endoscopy was accomplished without difficulty. The patient                     tolerated the procedure well. Findings:      The examined esophagus was normal. Biopsies were taken with a  cold      forceps for histology.      The entire examined stomach was normal.      The examined duodenum was normal. Impression:        - Normal esophagus. Biopsied.                    - Normal stomach.                    - Normal examined duodenum. Recommendation:    - Discharge patient to home.                    - Observe patient's clinical course.                    - Await pathology results.                    - Continue present medications.                    - The findings and recommendations were discussed with the                     patient. Procedure Code(s): --- Professional ---                    [phone], Esophagogastroduodenoscopy, flexible, transoral;                     with biopsy, single or multiple CPT copyright 2014 American Medical Association. All rights reserved. The codes documented in this report are preliminary and upon coder review may  be revised to meet current compliance requirements. Wallace Cullens, MD 07/31/2015 10:13:28 AM This report has been signed electronically. Number of Addenda: 0 Note Initiated On: 07/31/2015 9:52 AM      [facility]

## 2015-07-31 NOTE — Anesthesia Preprocedure Evaluation (Signed)
Anesthesia Evaluation  Patient identified by MRN, date of birth, ID band Patient awake    Reviewed: Allergy & Precautions, H&P , NPO status , Patient's Chart, lab work & pertinent test results, reviewed documented beta blocker date and time   Airway Mallampati: II  TM Distance: >3 FB Neck ROM: full    Dental no notable dental hx. (+) Teeth Intact   Pulmonary neg pulmonary ROS, sleep apnea ,  breath sounds clear to auscultation  Pulmonary exam normal       Cardiovascular Exercise Tolerance: Good negative cardio ROS  Rhythm:regular Rate:Normal     Neuro/Psych negative neurological ROS  negative psych ROS   GI/Hepatic negative GI ROS, Neg liver ROS, GERD-  ,  Endo/Other  negative endocrine ROSHypothyroidism   Renal/GU negative Renal ROS  negative genitourinary   Musculoskeletal   Abdominal   Peds  Hematology negative hematology ROS (+)   Anesthesia Other Findings   Reproductive/Obstetrics negative OB ROS                             Anesthesia Physical Anesthesia Plan  ASA: II  Anesthesia Plan: General   Post-op Pain Management:    Induction:   Airway Management Planned:   Additional Equipment:   Intra-op Plan:   Post-operative Plan:   Informed Consent: I have reviewed the patients History and Physical, chart, labs and discussed the procedure including the risks, benefits and alternatives for the proposed anesthesia with the patient or authorized representative who has indicated his/her understanding and acceptance.   Dental Advisory Given  Plan Discussed with: CRNA  Anesthesia Plan Comments:         Anesthesia Quick Evaluation

## 2015-07-31 NOTE — Anesthesia Postprocedure Evaluation (Signed)
  Anesthesia Post-op Note  Patient: Alexis Newman  Procedure(s) Performed: Procedure(s): ESOPHAGOGASTRODUODENOSCOPY (EGD) WITH PROPOFOL (N/A)  Anesthesia type:General  Patient location: PACU  Post pain: Pain level controlled  Post assessment: Post-op Vital signs reviewed, Patient's Cardiovascular Status Stable, Respiratory Function Stable, Patent Airway and No signs of Nausea or vomiting  Post vital signs: Reviewed and stable  Last Vitals:  Filed Vitals:   07/31/15 0917  BP: 144/79  Pulse: 68  Temp: 36.8 C  Resp: 20    Level of consciousness: awake, alert  and patient cooperative  Complications: No apparent anesthesia complications

## 2015-08-05 LAB — SURGICAL PATHOLOGY

## 2015-09-04 ENCOUNTER — Telehealth: Payer: Self-pay | Admitting: Family Medicine

## 2015-10-09 ENCOUNTER — Telehealth: Payer: Self-pay

## 2015-10-09 NOTE — Telephone Encounter (Signed)
LMTCB regarding vaccines. We have no record of administering any vaccines in 2005 prior to [place] trip. [facility]. Allene DillonEmily Drozdowski, CMA

## 2015-10-09 NOTE — Telephone Encounter (Signed)
[facility] as below. Allene DillonEmily Drozdowski, CMA

## 2015-12-22 ENCOUNTER — Other Ambulatory Visit: Payer: Self-pay | Admitting: Family Medicine

## 2015-12-22 DIAGNOSIS — E069 Thyroiditis, unspecified: Secondary | ICD-10-CM

## 2015-12-29 ENCOUNTER — Telehealth: Payer: Self-pay | Admitting: Family Medicine

## 2015-12-29 DIAGNOSIS — M1990 Unspecified osteoarthritis, unspecified site: Secondary | ICD-10-CM

## 2015-12-29 DIAGNOSIS — E559 Vitamin D deficiency, unspecified: Secondary | ICD-10-CM

## 2015-12-29 NOTE — Telephone Encounter (Signed)
Pt is requesting a written Rx for over the counter Vitamin D, Tylenol and Ibuprofen.  Pt is requesting this for health savings account.  XB#[phone]/[id number]

## 2015-12-29 NOTE — Telephone Encounter (Signed)
Ok to print out prescriptions.  Thanks.

## 2015-12-30 MED ORDER — VITAMIN D 1000 UNITS PO TABS: 1000.0000 [IU] | ORAL_TABLET | Freq: Every day | ORAL | Status: DC

## 2015-12-30 MED ORDER — ACETAMINOPHEN 500 MG PO TABS: 500.0000 mg | ORAL_TABLET | Freq: Four times a day (QID) | ORAL | Status: DC | PRN

## 2015-12-30 MED ORDER — IBUPROFEN 200 MG PO TABS: 200.0000 mg | ORAL_TABLET | Freq: Four times a day (QID) | ORAL | Status: DC | PRN

## 2015-12-30 NOTE — Telephone Encounter (Signed)
Pt advised that her prescriptions are ready to be picked up. sd

## 2016-02-08 ENCOUNTER — Telehealth: Payer: Self-pay | Admitting: Family Medicine

## 2016-02-08 DIAGNOSIS — Z1239 Encounter for other screening for malignant neoplasm of breast: Secondary | ICD-10-CM

## 2016-02-08 NOTE — Telephone Encounter (Signed)
Pt is requesting an order for a screening mammogram sent to her employer, fax #[phone].  UJ#[phone]/FACB#[phone]/[id number]

## 2016-02-08 NOTE — Telephone Encounter (Signed)
Patient also needed an order faxed to employer to receive an extra benefit that is offered through work. Will fax signed order to employer.

## 2016-02-08 NOTE — Telephone Encounter (Signed)
Order placed for mammogram. L/M telling that order has been placed. Also, wanted to clarify if [place] to [place]?

## 2016-02-08 NOTE — Telephone Encounter (Signed)
Ok to put in order. Thanks.   

## 2016-03-01 ENCOUNTER — Other Ambulatory Visit: Payer: Self-pay | Admitting: Family Medicine

## 2016-03-01 DIAGNOSIS — Z1231 Encounter for screening mammogram for malignant neoplasm of breast: Secondary | ICD-10-CM

## 2016-03-03 ENCOUNTER — Ambulatory Visit
Admission: RE | Admit: 2016-03-03 | Discharge: 2016-03-03 | Disposition: A | Discharge status: Home / Self Care | Payer: BLUE CROSS/BLUE SHIELD | Source: Ambulatory Visit | Attending: Family Medicine | Admitting: Family Medicine

## 2016-03-03 DIAGNOSIS — Z1239 Encounter for other screening for malignant neoplasm of breast: Secondary | ICD-10-CM

## 2016-03-03 DIAGNOSIS — N6489 Other specified disorders of breast: Secondary | ICD-10-CM | POA: Diagnosis not present

## 2016-03-03 DIAGNOSIS — Z1231 Encounter for screening mammogram for malignant neoplasm of breast: Secondary | ICD-10-CM | POA: Insufficient documentation

## 2016-03-25 ENCOUNTER — Other Ambulatory Visit: Payer: Self-pay | Admitting: Physician Assistant

## 2016-03-26 ENCOUNTER — Other Ambulatory Visit: Payer: Self-pay | Admitting: Family Medicine

## 2016-03-26 DIAGNOSIS — E069 Thyroiditis, unspecified: Secondary | ICD-10-CM

## 2016-04-06 ENCOUNTER — Encounter: Payer: Self-pay | Admitting: Family Medicine

## 2016-04-06 ENCOUNTER — Ambulatory Visit (INDEPENDENT_AMBULATORY_CARE_PROVIDER_SITE_OTHER): Payer: BLUE CROSS/BLUE SHIELD | Admitting: Family Medicine

## 2016-04-06 VITALS — BP 138/88 | HR 72 | Temp 98.4°F | Resp 16 | Wt 218.0 lb

## 2016-04-06 DIAGNOSIS — G4730 Sleep apnea, unspecified: Secondary | ICD-10-CM | POA: Diagnosis not present

## 2016-04-06 DIAGNOSIS — G4700 Insomnia, unspecified: Secondary | ICD-10-CM | POA: Insufficient documentation

## 2016-04-06 DIAGNOSIS — R739 Hyperglycemia, unspecified: Secondary | ICD-10-CM

## 2016-04-06 DIAGNOSIS — R5383 Other fatigue: Secondary | ICD-10-CM | POA: Diagnosis not present

## 2016-04-06 DIAGNOSIS — M1000 Idiopathic gout, unspecified site: Secondary | ICD-10-CM | POA: Diagnosis not present

## 2016-04-06 DIAGNOSIS — E7800 Pure hypercholesterolemia, unspecified: Secondary | ICD-10-CM

## 2016-04-06 MED ORDER — TRAZODONE HCL 50 MG PO TABS: 25.0000 mg | ORAL_TABLET | Freq: Every evening | ORAL | Status: DC | PRN

## 2016-04-06 NOTE — Progress Notes (Signed)
Subjective:    Patient ID: Alexis Newman, female    DOB: Jan 18, 1956, 60 y.o.   MRN: [id number]  URI  This is a new problem. The current episode started 1 to 4 weeks ago (x 2 weeks). The problem has been gradually improving. There has been no fever. Associated symptoms include chest pain, congestion (green sputum), coughing, headaches, a plugged ear sensation, rhinorrhea, sneezing and a sore throat. Pertinent negatives include no abdominal pain, diarrhea, dysuria, ear pain, nausea, sinus pain, swollen glands, vomiting or wheezing. Treatments tried: Day Quil, Ny Quil. The treatment provided significant relief.   Also not sleeping well. May have some depression.  Not really enjoying work anymore and not take care of herself.  Not eating or exercising like she should.  Is using her CPAP, but having more trouble with it since she has been sick.  Never slept well before that.  Would consider a medication to help her sleep      Review of Systems  HENT: Positive for congestion (green sputum), rhinorrhea, sneezing and sore throat. Negative for ear pain.   Respiratory: Positive for cough. Negative for wheezing.   Cardiovascular: Positive for chest pain.  Gastrointestinal: Negative for nausea, vomiting, abdominal pain and diarrhea.  Genitourinary: Negative for dysuria.  Neurological: Positive for headaches.   BP 138/88 mmHg  Pulse 72  Temp(Src) 98.4 F (36.9 C) (Oral)  Resp 16  Wt 218 lb (98.884 kg)  SpO2 98%   Patient Active Problem List   Diagnosis Date Noted  . Fatigue 07/06/2015  . Palpitations 07/06/2015  . Itch of skin 05/15/2015  . Endometriosis 05/15/2015  . Acid reflux 05/15/2015  . Gout 05/15/2015  . Difficulty hearing 05/15/2015  . Osteoarthrosis 05/15/2015  . Polycystic ovaries 05/15/2015  . Hypercholesterolemia without hypertriglyceridemia 05/15/2015  . Apnea, sleep 05/15/2015  . Thyroiditis 05/15/2015  . Avitaminosis D 05/15/2015  . Pruritic dermatitis 05/15/2015  .  Arthritis 04/08/2015  . Plantar fasciitis 04/08/2015  . Chicken pox 04/08/2015  . 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 Mononucleosis 06/28/2009   Past Medical History  Diagnosis Date  . Allergy   . Thyroid disease   . GERD (gastroesophageal reflux disease)   . Chickenpox   . Pseudogout   . Arthritis   . Mononucleosis   . Sleep apnea   . Hypothyroidism    Current Outpatient Prescriptions on File Prior to Visit  Medication Sig  . acetaminophen (TYLENOL) 500 MG tablet Take 1 tablet (500 mg total) by mouth every 6 (six) hours as needed.  . cholecalciferol (VITAMIN D) 1000 units tablet Take 1 tablet (1,000 Units total) by mouth daily.  Marland Kitchen. omeprazole (PRILOSEC) 40 MG capsule Take 40 mg by mouth daily.  Marland Kitchen. SYNTHROID 112 MCG tablet TAKE 1 TABLET BY MOUTH DAILY   No current facility-administered medications on file prior to visit.   No Known Allergies Past Surgical History  Procedure Laterality Date  . Replacement total knee Left 2004  . Knee surgery Bilateral     Multiple surgies bilaterally.   . Abdominal hysterectomy  12/2002    Still has cervix. Due to endometriosis  . Iliotibial band release  1983    Iliotibial extraaticcular reconstruction   . Osteotomy  1992    Partial osteotomy  . Ovarian cyst surgery  1978 & 1983  . Joint replacement    . Esophagogastroduodenoscopy (egd) with propofol N/A 07/31/2015    Procedure: ESOPHAGOGASTRODUODENOSCOPY (EGD) WITH PROPOFOL;  Surgeon: Renae FicklePaul  Guadelupe Sabin, MD;  Location: [facility] ENDOSCOPY;  Service: Gastroenterology;  Laterality: N/A;   Social History   Social History  . Marital Status: Married    Spouse Name: Pieter Partridge  . Number of Children: 0  . Years of Education: Ph.D.   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 Smoking status: Never Smoker   . Smokeless tobacco: Never Used  . Alcohol Use: 1.2 oz/week    2 Cans of beer, 0 Standard  drinks or equivalent per week     Comment: Occasionally  . Drug Use: No  . Sexual Activity: Not on file   Other Topics Concern  . Not on file   Social History Narrative   Family History  Problem Relation Age of Onset  . Diabetes Mother   . Glaucoma Mother   . Stroke Father   . Hypertension Father   . Skin cancer Father   . Dementia Father   . Healthy Sister   . Hypertension Brother   . Diabetes Brother   . Stroke Maternal Grandfather        Objective:   Physical Exam  Constitutional: She appears well-developed and well-nourished.  HENT:  Head: Normocephalic and atraumatic.  Right Ear: Tympanic membrane normal.  Left Ear: Tympanic membrane normal.  Nose: Right sinus exhibits no maxillary sinus tenderness and no frontal sinus tenderness. Left sinus exhibits no maxillary sinus tenderness and no frontal sinus tenderness.  Mouth/Throat: Oropharynx is clear and moist.  Turbinates erythematous.  Neck: Normal range of motion. Neck supple. No thyromegaly present.  Lymphadenopathy:    She has no cervical adenopathy.  Psychiatric: She has a normal mood and affect. Her behavior is normal.      Assessment & Plan:   1. Insomnia New problem. Worsening.   Will start Trazodone. Work on lifestyle changes and call if worsens or does not improve.    - traZODone (DESYREL) 50 MG tablet; Take 0.5-1 tablets (25-50 mg total) by mouth at bedtime as needed for sleep.  Dispense: 30 tablet; Refill: 5  2. Apnea, sleep Continue CPAP.    3. Hypercholesterolemia without hypertriglyceridemia Will check labs.   - Lipid panel  4. Other fatigue Will check labs. Suspect will improve with increased sleep and lifestyle changes.   - CBC with Differential/Platelet - Comprehensive Metabolic Panel (CMET) - TSH  5. Idiopathic gout, unspecified chronicity, unspecified site Check labs.  - Uric acid  6. Hyperglycemia Will check labs.   - Hemoglobin A1c    Patient seen and examined by Leo Grosser, MD, and note scribed by Allene Dillon, CMA.   I have reviewed the document for accuracy and completeness and I agree with above. Leo Grosser, MD   Lorie Phenix, MD

## 2016-04-07 ENCOUNTER — Telehealth: Payer: Self-pay

## 2016-04-07 LAB — CBC WITH DIFFERENTIAL/PLATELET
BASOS ABS: 0 10*3/uL (ref 0.0–0.2)
Basos: 1 %
EOS (ABSOLUTE): 0.2 10*3/uL (ref 0.0–0.4)
Eos: 3 %
Hematocrit: 40.8 % (ref 34.0–46.6)
Hemoglobin: 13.2 g/dL (ref 11.1–15.9)
IMMATURE GRANS (ABS): 0 10*3/uL (ref 0.0–0.1)
IMMATURE GRANULOCYTES: 0 %
LYMPHS: 31 %
Lymphocytes Absolute: 2.1 10*3/uL (ref 0.7–3.1)
MCH: 28.4 pg (ref 26.6–33.0)
MCHC: 32.4 g/dL (ref 31.5–35.7)
MCV: 88 fL (ref 79–97)
MONOS ABS: 0.6 10*3/uL (ref 0.1–0.9)
Monocytes: 8 %
NEUTROS PCT: 57 %
Neutrophils Absolute: 3.7 10*3/uL (ref 1.4–7.0)
Platelets: 238 10*3/uL (ref 150–379)
RBC: 4.64 x10E6/uL (ref 3.77–5.28)
RDW: 13.7 % (ref 12.3–15.4)
WBC: 6.6 10*3/uL (ref 3.4–10.8)

## 2016-04-07 LAB — COMPREHENSIVE METABOLIC PANEL
A/G RATIO: 1.6 (ref 1.2–2.2)
ALBUMIN: 4.3 g/dL (ref 3.6–4.8)
ALK PHOS: 67 IU/L (ref 39–117)
ALT: 34 IU/L — ABNORMAL HIGH (ref 0–32)
AST: 28 IU/L (ref 0–40)
BILIRUBIN TOTAL: 0.2 mg/dL (ref 0.0–1.2)
BUN / CREAT RATIO: 22 (ref 12–28)
BUN: 16 mg/dL (ref 8–27)
CHLORIDE: 99 mmol/L (ref 96–106)
CO2: 28 mmol/L (ref 18–29)
CREATININE: 0.74 mg/dL (ref 0.57–1.00)
Calcium: 9.7 mg/dL (ref 8.7–10.3)
GFR calc Af Amer: 102 mL/min/{1.73_m2} (ref >59)
GFR calc non Af Amer: 88 mL/min/{1.73_m2} (ref >59)
GLOBULIN, TOTAL: 2.7 g/dL (ref 1.5–4.5)
Glucose: 99 mg/dL (ref 65–99)
POTASSIUM: 5.1 mmol/L (ref 3.5–5.2)
SODIUM: 142 mmol/L (ref 134–144)
Total Protein: 7 g/dL (ref 6.0–8.5)

## 2016-04-07 LAB — HEMOGLOBIN A1C
ESTIMATED AVERAGE GLUCOSE: 131 mg/dL
Hgb A1c MFr Bld: 6.2 % — ABNORMAL HIGH (ref 4.8–5.6)

## 2016-04-07 LAB — LIPID PANEL
Chol/HDL Ratio: 4.2 ratio units (ref 0.0–4.4)
Cholesterol, Total: 215 mg/dL — ABNORMAL HIGH (ref 100–199)
HDL: 51 mg/dL (ref >39)
LDL Calculated: 139 mg/dL — ABNORMAL HIGH (ref 0–99)
Triglycerides: 124 mg/dL (ref 0–149)
VLDL Cholesterol Cal: 25 mg/dL (ref 5–40)

## 2016-04-07 LAB — URIC ACID: URIC ACID: 6.2 mg/dL (ref 2.5–7.1)

## 2016-04-07 LAB — TSH: TSH: 0.969 u[IU]/mL (ref 0.450–4.500)

## 2016-04-07 NOTE — Telephone Encounter (Signed)
-----   Message from Lorie PhenixNancy Maloney, MD sent at 04/07/2016  1:59 PM EDT ----- Labs borderline. Uric acid is ok.   Blood sugar is higher than would like it, in pre-diabetic range with Hgb A1c at 6.2. 6.5 is diabetic. Cholesterol ok at 215 with HDL at 51. However, if develops diabetes, 10 year risk of heart disease is 8 percent and medication would be advised. Recommend aggressive lifestyle changes as discussed at ov and recheck in 6 months. Thanks.

## 2016-04-07 NOTE — Telephone Encounter (Signed)
LMTCB Emily Drozdowski, CMA  

## 2016-04-08 NOTE — Telephone Encounter (Signed)
Patient advised as below. Patient verbalizes understanding and is in agreement with treatment plan.  

## 2016-07-05 NOTE — Telephone Encounter (Signed)
error 

## 2016-07-11 ENCOUNTER — Encounter: Payer: Self-pay | Admitting: Physician Assistant

## 2016-07-11 ENCOUNTER — Ambulatory Visit (INDEPENDENT_AMBULATORY_CARE_PROVIDER_SITE_OTHER): Payer: BLUE CROSS/BLUE SHIELD | Admitting: Physician Assistant

## 2016-07-11 VITALS — BP 140/80 | HR 68 | Temp 98.3°F | Resp 16 | Wt 218.6 lb

## 2016-07-11 DIAGNOSIS — K219 Gastro-esophageal reflux disease without esophagitis: Secondary | ICD-10-CM

## 2016-07-11 DIAGNOSIS — G479 Sleep disorder, unspecified: Secondary | ICD-10-CM | POA: Diagnosis not present

## 2016-07-11 MED ORDER — DIPHENHYDRAMINE-ACETAMINOPHEN 25-500 MG PO TABS: 1.0000 | ORAL_TABLET | Freq: Every evening | ORAL | 0 refills | Status: DC | PRN

## 2016-07-11 MED ORDER — OMEPRAZOLE 40 MG PO CPDR: 40.0000 mg | DELAYED_RELEASE_CAPSULE | Freq: Every day | ORAL | 3 refills | Status: DC

## 2016-07-11 NOTE — Patient Instructions (Signed)
Food Choices for Gastroesophageal Reflux Disease, Adult When you have gastroesophageal reflux disease (GERD), the foods you eat and your eating habits are very important. Choosing the right foods can help ease the discomfort of GERD. WHAT GENERAL GUIDELINES DO I NEED TO FOLLOW?  Choose fruits, vegetables, whole grains, low-fat dairy products, and low-fat meat, fish, and poultry.  Limit fats such as oils, salad dressings, butter, nuts, and avocado.  Keep a food diary to identify foods that cause symptoms.  Avoid foods that cause reflux. These may be different for different people.  Eat frequent small meals instead of three large meals each day.  Eat your meals slowly, in a relaxed setting.  Limit fried foods.  Cook foods using methods other than frying.  Avoid drinking alcohol.  Avoid drinking large amounts of liquids with your meals.  Avoid bending over or lying down until 2-3 hours after eating. WHAT FOODS ARE NOT RECOMMENDED? The following are some foods and drinks that may worsen your symptoms: Vegetables Tomatoes. Tomato juice. Tomato and spaghetti sauce. Chili peppers. Onion and garlic. Horseradish. Fruits Oranges, grapefruit, and lemon (fruit and juice). Meats High-fat meats, fish, and poultry. This includes hot dogs, ribs, ham, sausage, salami, and bacon. Dairy Whole milk and chocolate milk. Sour cream. Cream. Butter. Ice cream. Cream cheese.  Beverages Coffee and tea, with or without caffeine. Carbonated beverages or energy drinks. Condiments Hot sauce. Barbecue sauce.  Sweets/Desserts Chocolate and cocoa. Donuts. Peppermint and spearmint. Fats and Oils High-fat foods, including French fries and potato chips. Other Vinegar. Strong spices, such as black pepper, white pepper, red pepper, cayenne, curry powder, cloves, ginger, and chili powder. The items listed above may not be a complete list of foods and beverages to avoid. Contact your dietitian for more  information.   This information is not intended to replace advice given to you by your health care provider. Make sure you discuss any questions you have with your health care provider.   Document Released: 11/14/2005 Document Revised: 12/05/2014 Document Reviewed: 09/18/2013 Elsevier Interactive Patient Education 2016 Elsevier Inc.  

## 2016-07-11 NOTE — Progress Notes (Signed)
       Patient: Alexis Newman Female    DOB: 1956/07/25   60 y.o.   MRN: [id number] Visit Date: 07/11/2016  Today's Provider: Margaretann LovelessJennifer M Burnette, PA-C   Chief Complaint  Patient presents with  . Medication Refill    Prisolec   Subjective:    HPI Patient is here for her Medication Refill on Prilosec. She feels stable on it, She reports that she can definitely sees the difference when she doesn't take.  She also reports that she stopped the Trazadone that was prescribed to help her with the Insomnia. She had some side effect- diarrhea. She went back to the Tylenol PM.    No Known Allergies Current Meds  Medication Sig  . acetaminophen (TYLENOL) 500 MG tablet Take 1 tablet (500 mg total) by mouth every 6 (six) hours as needed.  . cholecalciferol (VITAMIN D) 1000 units tablet Take 1 tablet (1,000 Units total) by mouth daily.  Marland Kitchen. omeprazole (PRILOSEC) 40 MG capsule Take 40 mg by mouth daily.  Marland Kitchen. SYNTHROID 112 MCG tablet TAKE 1 TABLET BY MOUTH DAILY    Review of Systems  Constitutional: Negative.   Respiratory: Negative.   Cardiovascular: Negative.   Gastrointestinal: Negative.   Neurological: Negative.   Psychiatric/Behavioral: Negative.     Social History  Substance Use Topics  . Smoking status: Never Smoker  . Smokeless tobacco: Never Used  . Alcohol use 1.2 oz/week    2 Cans of beer per week     Comment: Occasionally   Objective:   BP 140/80 (BP Location: Left Arm, Patient Position: Sitting, Cuff Size: Normal)   Pulse 68   Temp 98.3 F (36.8 C) (Oral)   Resp 16   Wt 218 lb 9.6 oz (99.2 kg)   BMI 35.28 kg/m   Physical Exam  Constitutional: She appears well-developed and well-nourished. No distress.  Cardiovascular: Normal rate, regular rhythm and normal heart sounds.  Exam reveals no gallop and no friction rub.   No murmur heard. Pulmonary/Chest: Effort normal and breath sounds normal. No respiratory distress. She has no wheezes. She has no rales.  Skin: She  is not diaphoretic.  Vitals reviewed.     Assessment & Plan:     1. Gastroesophageal reflux disease, esophagitis presence not specified Stable. Diagnosis pulled for medication refill. Continue current medical treatment plan. I will see her back in 4 months to recheck HgBA1c, lipids, TSH, CBC and BMP.  - omeprazole (PRILOSEC) 40 MG capsule; Take 1 capsule (40 mg total) by mouth daily.  Dispense: 90 capsule; Refill: 3  2. Difficulty sleeping Stable. Patient discontinued trazodone due to diarrhea. She feels well with Tylenol PM prn. Continue current medical treatment plan. - Diphenhydramine-APAP 25-500 MG TABS; Take 1 tablet by mouth at bedtime as needed.  Dispense: 30 each; Refill: 0       Margaretann LovelessJennifer M Burnette, PA-C  [facility]

## 2017-03-08 ENCOUNTER — Telehealth: Payer: Self-pay | Admitting: Physician Assistant

## 2017-03-08 NOTE — Telephone Encounter (Signed)
Pt called saying she needs a written order for a mammgram to be faxed to [phone].  [employer] employess have a program with [employer] and the request their employess get mammograms through this program.  If you have any questions [phone]  [facility]

## 2017-03-08 NOTE — Telephone Encounter (Signed)
Please review

## 2017-03-09 NOTE — Telephone Encounter (Signed)
Written Rx will be faxed today. I will hand to Joseline to fax.

## 2017-03-09 NOTE — Telephone Encounter (Signed)
Advised  ED 

## 2017-03-13 ENCOUNTER — Other Ambulatory Visit: Payer: Self-pay | Admitting: Physician Assistant

## 2017-03-13 DIAGNOSIS — Z1231 Encounter for screening mammogram for malignant neoplasm of breast: Secondary | ICD-10-CM

## 2017-03-15 ENCOUNTER — Ambulatory Visit
Admission: RE | Admit: 2017-03-15 | Discharge: 2017-03-15 | Disposition: A | Discharge status: Home / Self Care | Payer: BLUE CROSS/BLUE SHIELD | Source: Ambulatory Visit | Attending: Physician Assistant | Admitting: Physician Assistant

## 2017-03-15 ENCOUNTER — Other Ambulatory Visit: Payer: Self-pay | Admitting: Physician Assistant

## 2017-03-15 DIAGNOSIS — Z1231 Encounter for screening mammogram for malignant neoplasm of breast: Secondary | ICD-10-CM | POA: Diagnosis not present

## 2017-03-15 DIAGNOSIS — E069 Thyroiditis, unspecified: Secondary | ICD-10-CM

## 2017-03-15 LAB — HM MAMMOGRAPHY

## 2017-03-15 MED ORDER — LEVOTHYROXINE SODIUM 112 MCG PO TABS: 112.0000 ug | ORAL_TABLET | Freq: Every day | ORAL | 3 refills | Status: DC

## 2017-03-15 NOTE — Telephone Encounter (Signed)
Pt needs refill on her Levothyroxine.  Pharmacy called for refill  [facility]  mailorder. [phone]  Thanks Barth Kirks

## 2017-03-16 ENCOUNTER — Telehealth: Payer: Self-pay

## 2017-03-16 NOTE — Telephone Encounter (Signed)
Pt returned Alexis Newman's call.  Barth Kirks

## 2017-03-16 NOTE — Telephone Encounter (Signed)
-----   Message from Margaretann Loveless, [place] sent at 03/16/2017 12:37 PM EDT ----- Normal mammogram. Repeat screening in one year.

## 2017-03-16 NOTE — Telephone Encounter (Signed)
LMTCB  Thanks,  -Mariah Gerstenberger 

## 2017-03-17 NOTE — Telephone Encounter (Signed)
Patient advised as directed below.  Thanks,  -Joseline 

## 2017-03-20 ENCOUNTER — Telehealth: Payer: Self-pay | Admitting: Physician Assistant

## 2017-03-20 NOTE — Telephone Encounter (Signed)
Pt needs 90 days of  levothyroxine (SYNTHROID) 112 MCG tablet  [facility]

## 2017-03-20 NOTE — Telephone Encounter (Signed)
error 

## 2017-05-09 ENCOUNTER — Other Ambulatory Visit: Payer: Self-pay | Admitting: Physician Assistant

## 2017-05-09 DIAGNOSIS — K219 Gastro-esophageal reflux disease without esophagitis: Secondary | ICD-10-CM

## 2017-09-13 ENCOUNTER — Ambulatory Visit (INDEPENDENT_AMBULATORY_CARE_PROVIDER_SITE_OTHER): Payer: BLUE CROSS/BLUE SHIELD | Admitting: Podiatry

## 2017-09-13 ENCOUNTER — Encounter: Payer: Self-pay | Admitting: Podiatry

## 2017-09-13 DIAGNOSIS — M722 Plantar fascial fibromatosis: Secondary | ICD-10-CM | POA: Diagnosis not present

## 2017-09-13 NOTE — Progress Notes (Signed)
She presents today states that everything is doing just great she states that her orthotics are worth $1 million dollars to her.  Objective: No physical exam.  Assessment history of plantar fasciitis.  Plan: She was casted for  Orthotics today.

## 2017-10-04 ENCOUNTER — Ambulatory Visit (INDEPENDENT_AMBULATORY_CARE_PROVIDER_SITE_OTHER): Payer: BLUE CROSS/BLUE SHIELD | Admitting: Orthotics

## 2017-10-04 DIAGNOSIS — M722 Plantar fascial fibromatosis: Secondary | ICD-10-CM

## 2017-10-04 NOTE — Progress Notes (Signed)
Patient came in today to pick up custom made foot orthotics.  The goals were accomplished and the patient reported no dissatisfaction with said orthotics.  Patient was advised of breakin period and how to report any issues. 

## 2017-10-19 ENCOUNTER — Other Ambulatory Visit: Payer: Self-pay | Admitting: Physician Assistant

## 2017-10-19 DIAGNOSIS — K219 Gastro-esophageal reflux disease without esophagitis: Secondary | ICD-10-CM

## 2017-10-20 NOTE — Telephone Encounter (Signed)
Last ov 8/14/817 Last filled 05/09/17 Please review. Thank you. sd

## 2017-10-20 NOTE — Telephone Encounter (Signed)
Have sent prescription to pharmacy, but [facility] Needs to schedule o.v or CPE within the next month or so.

## 2017-11-16 ENCOUNTER — Other Ambulatory Visit: Payer: Self-pay

## 2017-11-16 ENCOUNTER — Ambulatory Visit (INDEPENDENT_AMBULATORY_CARE_PROVIDER_SITE_OTHER): Payer: BLUE CROSS/BLUE SHIELD | Admitting: Physician Assistant

## 2017-11-16 VITALS — BP 142/80 | HR 96 | Temp 98.1°F | Resp 16 | Wt 223.0 lb

## 2017-11-16 DIAGNOSIS — N898 Other specified noninflammatory disorders of vagina: Secondary | ICD-10-CM

## 2017-11-16 DIAGNOSIS — B079 Viral wart, unspecified: Secondary | ICD-10-CM | POA: Diagnosis not present

## 2017-11-16 DIAGNOSIS — R233 Spontaneous ecchymoses: Secondary | ICD-10-CM | POA: Diagnosis not present

## 2017-11-16 DIAGNOSIS — N952 Postmenopausal atrophic vaginitis: Secondary | ICD-10-CM | POA: Diagnosis not present

## 2017-11-16 DIAGNOSIS — Z1283 Encounter for screening for malignant neoplasm of skin: Secondary | ICD-10-CM

## 2017-11-16 LAB — POCT URINALYSIS DIPSTICK
Bilirubin, UA: NEGATIVE
GLUCOSE UA: NEGATIVE
Ketones, UA: NEGATIVE
LEUKOCYTES UA: NEGATIVE
Nitrite, UA: NEGATIVE
Protein, UA: NEGATIVE
SPEC GRAV UA: 1.015 (ref 1.010–1.025)
Urobilinogen, UA: NEGATIVE E.U./dL — AB
pH, UA: 6 (ref 5.0–8.0)

## 2017-11-16 MED ORDER — TRIAMCINOLONE ACETONIDE 0.1 % EX CREA: 1.0000 "application " | TOPICAL_CREAM | Freq: Two times a day (BID) | CUTANEOUS | 0 refills | Status: DC

## 2017-11-16 MED ORDER — ESTROGENS, CONJUGATED 0.625 MG/GM VA CREA: 1.0000 | TOPICAL_CREAM | Freq: Every day | VAGINAL | 12 refills | Status: DC

## 2017-11-16 NOTE — Progress Notes (Signed)
Alexis MinksJoyce A Newman  MRN: [id number] DOB: [birth year]  Subjective:  HPI  The patient is a 61 year old female who presents for evaluation of possible yeast infection.  She reports that she has had itching for 6 mo to 1 year.  She has no discharge.  She denies intercourse. She also complains that her urine smells.  She also complains of a rash on her shins bilat.  She state she gets this when she travels and she was just in [place].  She said it does not itch, burn or spread any more than on her shins.  Patient states she has an irregularity on the back of her neck.  She describes it like a pimple but states it never comes to a head and never gets bigger or smaller.    Patient has a wart on her left knee.  She has had that for about 1 year.   She would really like to know if she needs to see dermatology for all of the skin conditions.  Patient Active Problem List   Diagnosis Date Noted  . Insomnia 04/06/2016  . Hyperglycemia 04/06/2016  . Other fatigue 07/06/2015  . Palpitations 07/06/2015  . Itch of skin 05/15/2015  . Endometriosis 05/15/2015  . Acid reflux 05/15/2015  . Gout 05/15/2015  . Difficulty hearing 05/15/2015  . Osteoarthrosis 05/15/2015  . Polycystic ovaries 05/15/2015  . Hypercholesterolemia without hypertriglyceridemia 05/15/2015  . Apnea, sleep 05/15/2015  . Thyroiditis 05/15/2015  . Avitaminosis D 05/15/2015  . Pruritic dermatitis 05/15/2015  . Arthritis 04/08/2015  . Plantar fasciitis 04/08/2015  . Chicken pox 04/08/2015  . 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 Mononucleosis 06/28/2009    Past Medical History:  Diagnosis Date  . Allergy   . Arthritis   . Chickenpox   . GERD (gastroesophageal reflux disease)   . Hypothyroidism   . Mononucleosis   . Pseudogout   . Sleep apnea   . Thyroid disease     Social History   Socioeconomic History  . Marital status: Married    Spouse name:  Alexis Newman  . Number of children: 0  . Years of education: Ph.D.  . Highest education level: Not on file  Social Needs  . Financial resource strain: Not on file  . Food insecurity - worry: Not on file  . Food insecurity - inability: Not on file  . Transportation needs - medical: Not on file  . Transportation needs - non-medical: Not on file  Occupational History  . Occupation: Professor    Employer: [employer]  Tobacco Use  . Smoking status: Never Smoker  . Smokeless tobacco: Never Used  Substance and Sexual Activity  . Alcohol use: Yes    Alcohol/week: 1.2 oz    Types: 2 Cans of beer per week    Comment: Occasionally  . Drug use: No  . Sexual activity: Not on file  Other Topics Concern  . Not on file  Social History Narrative  . Not on file    Outpatient Encounter Medications as of 11/16/2017  Medication Sig  . acetaminophen (TYLENOL) 500 MG tablet Take 1 tablet (500 mg total) by mouth every 6 (six) hours as needed.  . cholecalciferol (VITAMIN D) 1000 units tablet Take 1 tablet (1,000 Units total) by mouth daily.  Marland Kitchen. levothyroxine (SYNTHROID) 112 MCG tablet Take 1 tablet (112 mcg total) by mouth daily.  Marland Kitchen. omeprazole (PRILOSEC) 40 MG capsule Take 1 capsule (40 mg total) by mouth  daily.  . conjugated estrogens (PREMARIN) vaginal cream Place 1 Applicatorful vaginally daily.  Marland Kitchen. triamcinolone cream (KENALOG) 0.1 % Apply 1 application topically 2 (two) times daily.   No facility-administered encounter medications on file as of 11/16/2017.     No Known Allergies  Review of Systems  Constitutional: Positive for malaise/fatigue. Negative for diaphoresis, fever and weight loss.  Respiratory: Negative for cough, hemoptysis, sputum production, shortness of breath and wheezing.   Cardiovascular: Positive for leg swelling (when she flys). Negative for chest pain, palpitations, orthopnea and claudication.  Genitourinary: Negative for dysuria, flank pain, frequency, hematuria and  urgency.       Vaginal itching  Skin: Positive for rash. Negative for itching.  Neurological: Negative for weakness.    Objective:  BP (!) 142/80 (BP Location: Right Arm, Patient Position: Sitting, Cuff Size: Normal)   Pulse 96   Temp 98.1 F (36.7 C) (Oral)   Resp 16   Wt 223 lb (101.2 kg)   BMI 35.99 kg/m   Physical Exam  Constitutional: She is well-developed, well-nourished, and in no distress. No distress.  HENT:  Head: Normocephalic and atraumatic.  Eyes: Conjunctivae are normal. Pupils are equal, round, and reactive to light.  Neck: Normal range of motion. Neck supple.  Cardiovascular: Normal rate, regular rhythm and normal heart sounds.  No murmur heard. Pulmonary/Chest: Effort normal and breath sounds normal. No respiratory distress.  Abdominal: Soft. Bowel sounds are normal. She exhibits no distension.  Genitourinary: Vagina exhibits normal mucosa, no exudate, no lesion and no rugosity.  Genitourinary Comments: Vaginal atrophy noted with vaginal thinning  Skin: Lesion and petechiae noted.     Vitals reviewed.   Assessment and Plan :  Vaginal itching - Plan: POCT urinalysis dipstick, POCT Wet Prep ([place]et [place])  Vaginal atrophy - Plan: conjugated estrogens (PREMARIN) vaginal cream  Petechial rash - Plan: triamcinolone cream (KENALOG) 0.1 %  Skin exam, screening for cancer - Plan: Ambulatory referral to Dermatology  Viral warts, unspecified type - Plan: Ambulatory referral to Dermatology   Will do trial of estrogen topical cream for vaginal itching which I suspect to be atrophic vaginitis. She is to call if no improvements.   Triamcinolone cream given for petechial rash. Discussed wearing compression stockings when she flies.   Dermatology referral made for the multiple skin complaints.

## 2017-11-20 ENCOUNTER — Encounter: Payer: Self-pay | Admitting: Physician Assistant

## 2017-11-20 LAB — POCT WET PREP (WET MOUNT): Trichomonas Wet Prep HPF POC: ABSENT

## 2017-11-20 NOTE — Patient Instructions (Signed)
Conjugated Estrogens injection What is this medicine? CONJUGATED ESTROGENS (CON ju gate ed ESS troe jenz) is a mixture of female hormones. It is used to treat abnormal bleeding from the uterus caused by a hormonal imbalance. This medicine may also be used for a short period of time to increase your body's estrogen levels. This medicine may be used for other purposes; ask your health care provider or pharmacist if you have questions. COMMON BRAND NAME(S): Premarin What should I tell my health care provider before I take this medicine? They need to know if you have any of these conditions: -blood vessel disease, blood clotting disorder, or suffered a stroke -breast, cervical, endometrial, ovarian or uterine cancer -dementia -gallbladder disease -heart disease -high blood levels of calcium -kidney disease -liver disease -protein C deficiency -protein S deficiency -vaginal bleeding -an unusual or allergic reaction to estrogens, other hormones, medicines, foods, dyes, or preservatives -pregnant or trying to get pregnant -breast-feeding How should I use this medicine? This medicine is for injection into a vein, or injection into a muscle. It is given by a health care professional in a hospital or clinic setting. A patient package insert for the product will be given with each prescription and refill. Read this sheet carefully each time. The sheet may change frequently. Talk to your pediatrician regarding the use of this medicine in children. Special care may be needed. Overdosage: If you think you have taken too much of this medicine contact a poison control center or emergency room at once. NOTE: This medicine is only for you. Do not share this medicine with others. What if I miss a dose? This does not apply. What may interact with this medicine? Do not take this medicine with any of the following medications: -exemestane This medicine may also interact with the following  medications: -barbiturates or benzodiazepines used for inducing sleep or treating seizures (convulsions) -carbamazepine -grapefruit juice -medicines for fungal infections like ketoconazole and itraconazole -raloxifene or tamoxifen -rifabutin, rifampin, or rifapentine -ritonavir -some antibiotics used to treat infections -St. John's Wort -warfarin This list may not describe all possible interactions. Give your health care provider a list of all the medicines, herbs, non-prescription drugs, or dietary supplements you use. Also tell them if you smoke, drink alcohol, or use illegal drugs. Some items may interact with your medicine. What should I watch for while using this medicine? This medicine can make your body retain fluid, making your fingers, hands, or ankles swell. Your blood pressure can go up. Contact your doctor or health care professional if you feel you are retaining fluid. If you have any reason to think you are pregnant, stop taking this medicine right away and contact your doctor or health care professional. Smoking increases the risk of getting a blood clot or having a stroke while you are taking this medicine, especially if you are more than 62 years old. You are strongly advised not to smoke. If you are going to have surgery, you may need to stop taking this medicine. Consult your health care professional for advice before you schedule the surgery. What side effects may I notice from receiving this medicine? Side effects that you should report to your doctor or health care professional as soon as possible: -allergic reactions like skin rash, itching or hives, swelling of the face, lips, or tongue -breakthrough bleeding and spotting -breast enlargement, tenderness, or abnormal production of milk -breathing problems -changes in vision -chest pain -confusion or forgetfulness -dark urine -general ill feeling or flu-like symptoms -  leg, arm, or groin pain -light-colored  stools -loss of appetite, nausea -nausea, vomiting -right upper belly pain -severe headaches -stomach pain -speech problems -unusually weak or tired -yellowing of the eyes or skin Side effects that usually do not require medical attention (report to your doctor or health care professional if they continue or are bothersome): -change in appetite -mood changes, anxiety, depression, frustration, anger, or emotional outbursts -skin acne or brown spots on the face -weight gain This list may not describe all possible side effects. Call your doctor for medical advice about side effects. You may report side effects to FDA at 1-800-FDA-1088. Where should I keep my medicine? This drug is given in a hospital or clinic and will not be stored at home. NOTE: This sheet is a summary. It may not cover all possible information. If you have questions about this medicine, talk to your doctor, pharmacist, or health care provider.  2018 Elsevier/Gold Standard (2011-02-16 09:21:17)

## 2018-01-13 ENCOUNTER — Other Ambulatory Visit: Payer: Self-pay | Admitting: Physician Assistant

## 2018-01-13 DIAGNOSIS — K219 Gastro-esophageal reflux disease without esophagitis: Secondary | ICD-10-CM

## 2018-02-02 ENCOUNTER — Other Ambulatory Visit: Payer: Self-pay | Admitting: Physician Assistant

## 2018-02-02 DIAGNOSIS — E069 Thyroiditis, unspecified: Secondary | ICD-10-CM

## 2018-04-19 ENCOUNTER — Other Ambulatory Visit: Payer: Self-pay | Admitting: Physician Assistant

## 2018-04-19 DIAGNOSIS — Z1231 Encounter for screening mammogram for malignant neoplasm of breast: Secondary | ICD-10-CM

## 2018-04-21 IMAGING — MG MM DIGITAL SCREENING BILAT W/ TOMO W/ CAD
8 of 12 series · 8 of 28 positions shown · non-contrast
Comparison: Previous exam(s).

CLINICAL DATA: Screening.

EXAM:
2D DIGITAL SCREENING BILATERAL MAMMOGRAM WITH CAD AND ADJUNCT TOMO

[L MLO]
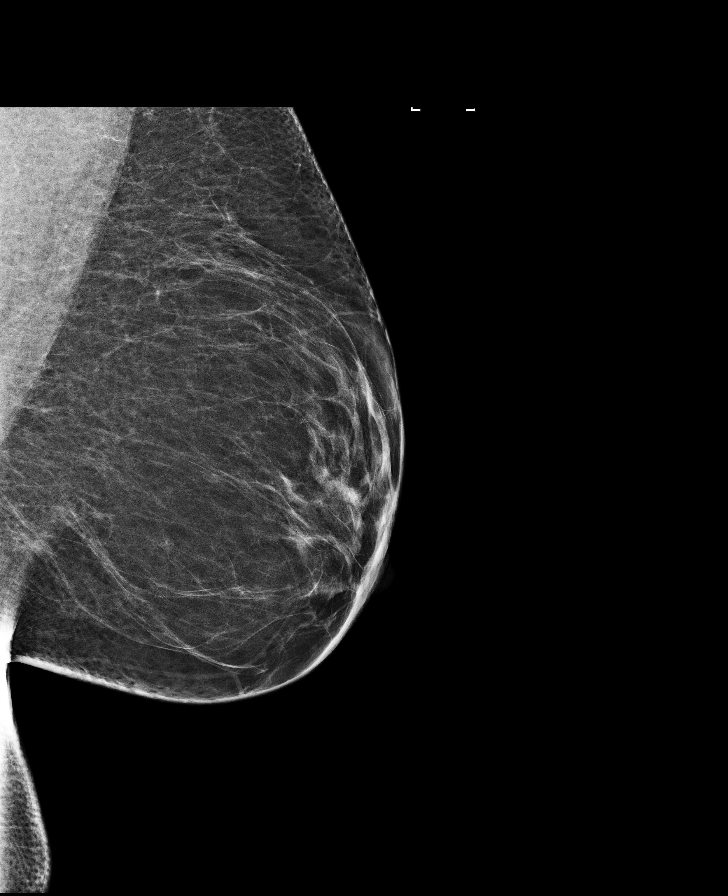

[R MLO]
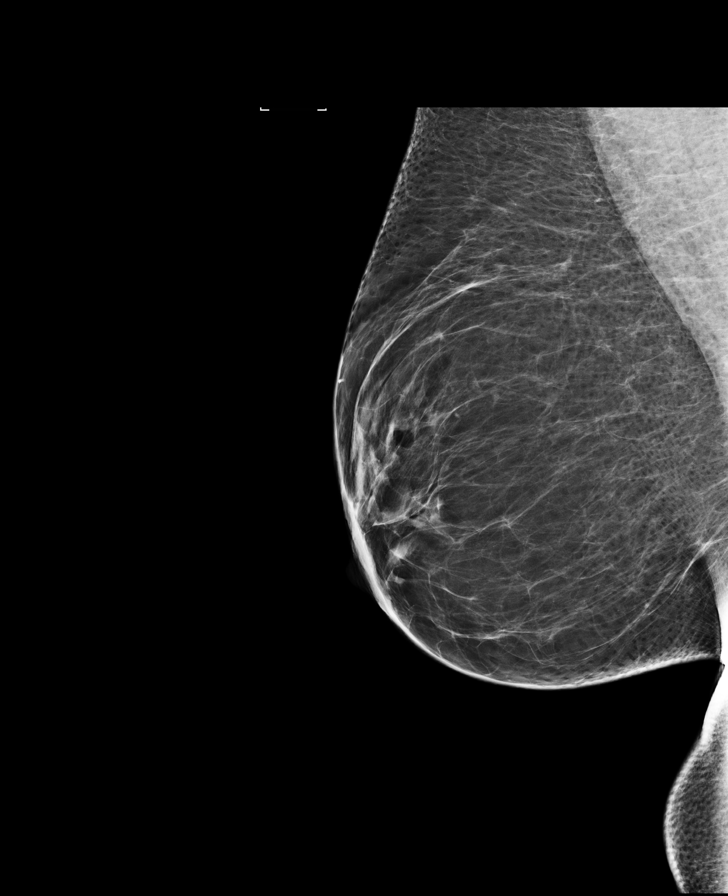

[R CC synth-2D]
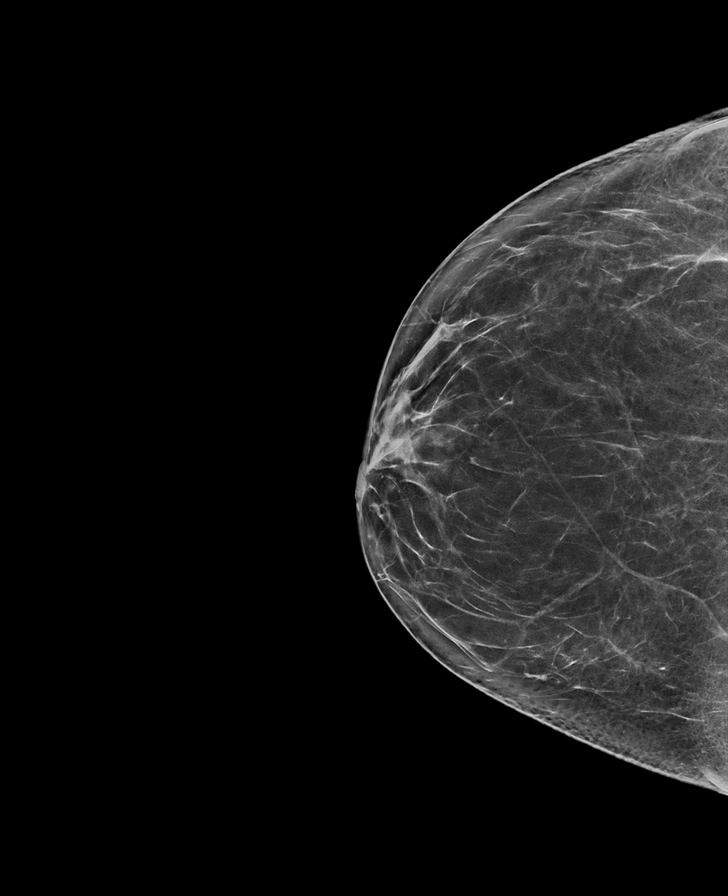

[L CC synth-2D]
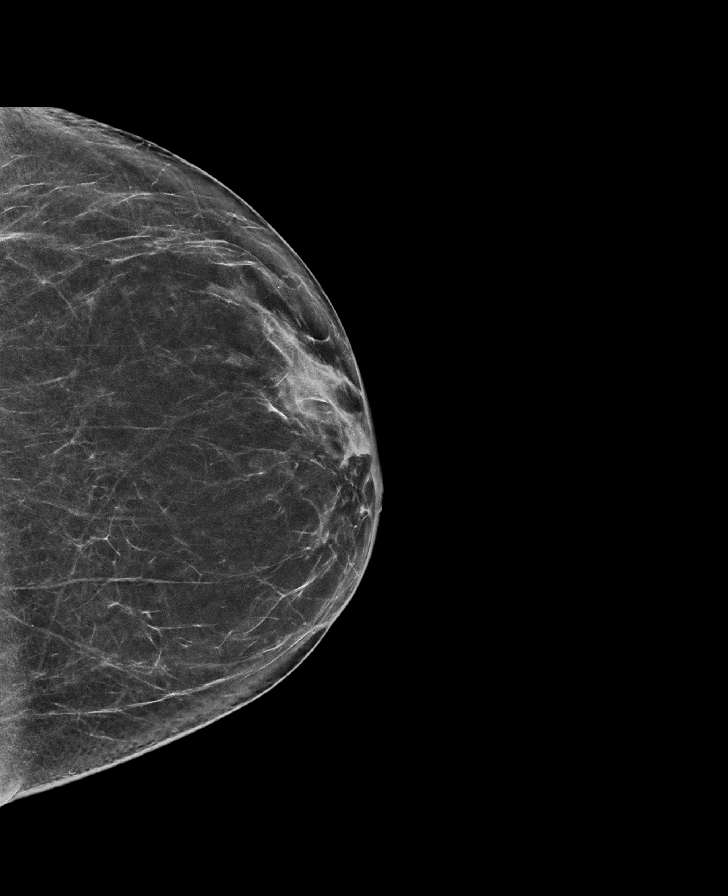

[R MLO synth-2D]
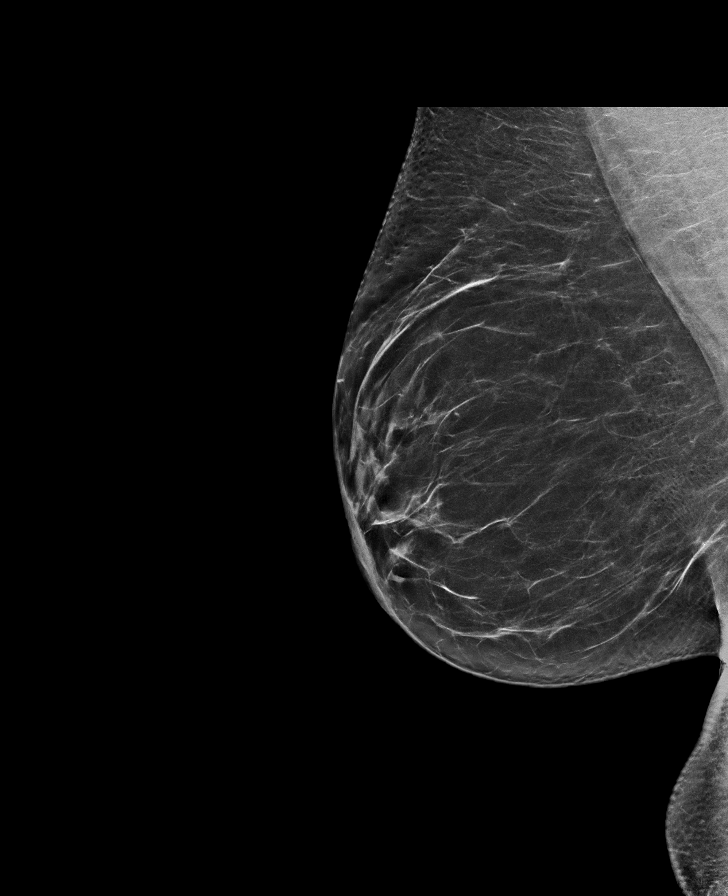

[L CC]
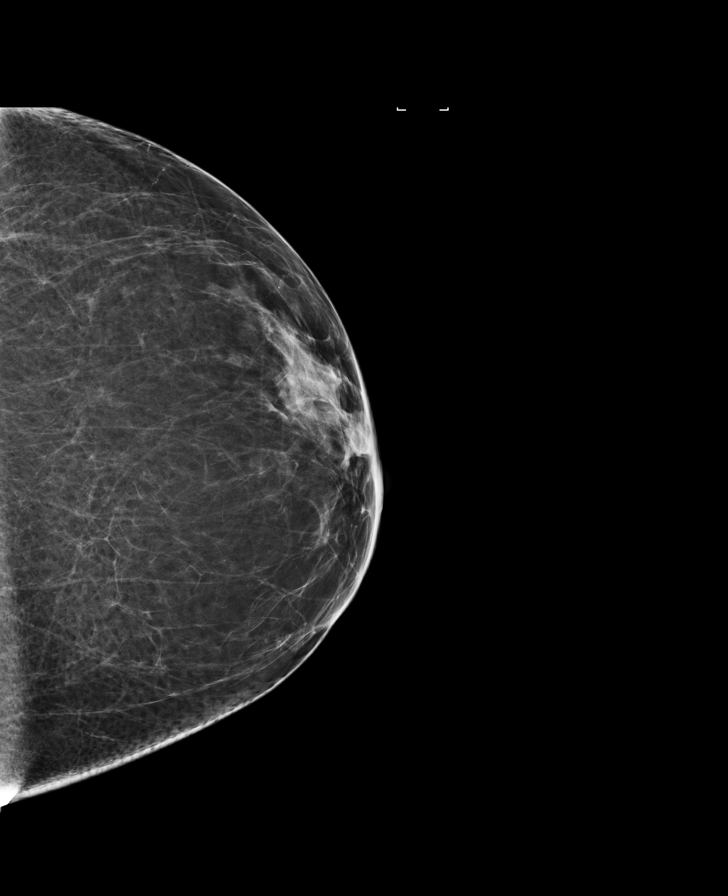

[L MLO synth-2D]
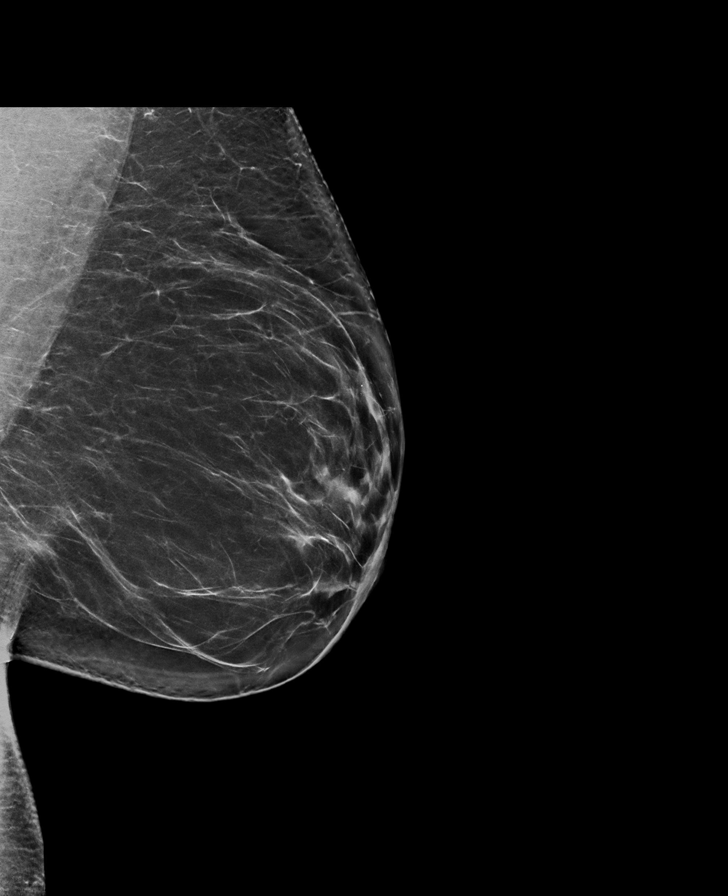

[R CC]
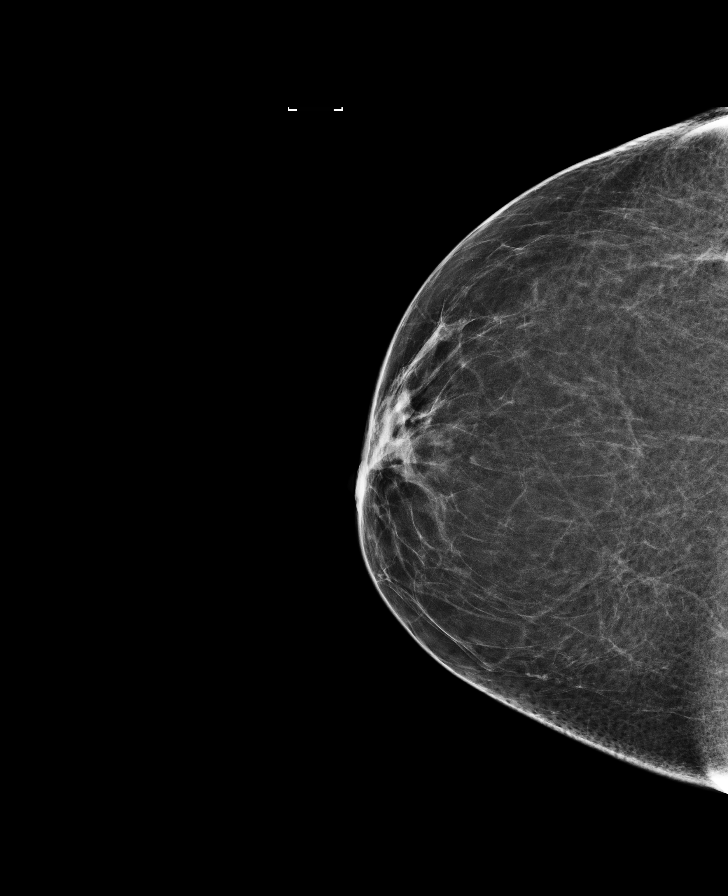

[8 of 28 positions shown; findings below may reference images not displayed]

ACR Breast Density Category b: There are scattered areas of
fibroglandular density.
FINDINGS: There are no findings suspicious for malignancy. Images were
processed with CAD.
IMPRESSION: No mammographic evidence of malignancy. A result letter of this
screening mammogram will be mailed directly to the patient.

RECOMMENDATION:
Screening mammogram in one year. (Code:97-6-RS4)

BI-RADS CATEGORY  1: Negative.

## 2018-05-10 ENCOUNTER — Ambulatory Visit
Admission: RE | Admit: 2018-05-10 | Discharge: 2018-05-10 | Disposition: A | Discharge status: Home / Self Care | Payer: BLUE CROSS/BLUE SHIELD | Source: Ambulatory Visit | Attending: Physician Assistant | Admitting: Physician Assistant

## 2018-05-10 DIAGNOSIS — Z1231 Encounter for screening mammogram for malignant neoplasm of breast: Secondary | ICD-10-CM | POA: Insufficient documentation

## 2018-05-11 ENCOUNTER — Telehealth: Payer: Self-pay

## 2018-05-11 NOTE — Telephone Encounter (Signed)
-----   Message from Margaretann LovelessJennifer M Burnette, [place]-C sent at 05/11/2018 10:24 AM EDT ----- Normal mammogram. Repeat screening in one year.

## 2018-05-11 NOTE — Telephone Encounter (Signed)
Patient advised as below.  

## 2018-05-22 DIAGNOSIS — L501 Idiopathic urticaria: Secondary | ICD-10-CM | POA: Diagnosis not present

## 2018-05-22 DIAGNOSIS — L299 Pruritus, unspecified: Secondary | ICD-10-CM | POA: Diagnosis not present

## 2018-05-22 DIAGNOSIS — L509 Urticaria, unspecified: Secondary | ICD-10-CM | POA: Diagnosis not present

## 2018-06-13 DIAGNOSIS — L853 Xerosis cutis: Secondary | ICD-10-CM | POA: Diagnosis not present

## 2018-06-13 DIAGNOSIS — L309 Dermatitis, unspecified: Secondary | ICD-10-CM | POA: Diagnosis not present

## 2018-06-13 DIAGNOSIS — L509 Urticaria, unspecified: Secondary | ICD-10-CM | POA: Diagnosis not present

## 2018-06-28 ENCOUNTER — Other Ambulatory Visit: Payer: Self-pay | Admitting: Physician Assistant

## 2018-06-28 DIAGNOSIS — K219 Gastro-esophageal reflux disease without esophagitis: Secondary | ICD-10-CM

## 2018-09-25 ENCOUNTER — Ambulatory Visit: Payer: BLUE CROSS/BLUE SHIELD | Admitting: Physician Assistant

## 2018-09-25 ENCOUNTER — Ambulatory Visit
Admission: RE | Admit: 2018-09-25 | Discharge: 2018-09-25 | Disposition: A | Discharge status: Home / Self Care | Payer: BLUE CROSS/BLUE SHIELD | Source: Ambulatory Visit | Attending: Physician Assistant | Admitting: Physician Assistant

## 2018-09-25 VITALS — BP 152/96 | HR 61 | Temp 97.7°F | Resp 16 | Wt 211.6 lb

## 2018-09-25 DIAGNOSIS — M25511 Pain in right shoulder: Secondary | ICD-10-CM

## 2018-09-25 DIAGNOSIS — M25512 Pain in left shoulder: Secondary | ICD-10-CM | POA: Diagnosis not present

## 2018-09-25 DIAGNOSIS — Z1329 Encounter for screening for other suspected endocrine disorder: Secondary | ICD-10-CM

## 2018-09-25 DIAGNOSIS — M25552 Pain in left hip: Secondary | ICD-10-CM

## 2018-09-25 DIAGNOSIS — M19012 Primary osteoarthritis, left shoulder: Secondary | ICD-10-CM | POA: Insufficient documentation

## 2018-09-25 DIAGNOSIS — R700 Elevated erythrocyte sedimentation rate: Secondary | ICD-10-CM

## 2018-09-25 DIAGNOSIS — Z1159 Encounter for screening for other viral diseases: Secondary | ICD-10-CM

## 2018-09-25 DIAGNOSIS — Z131 Encounter for screening for diabetes mellitus: Secondary | ICD-10-CM

## 2018-09-25 DIAGNOSIS — G8929 Other chronic pain: Secondary | ICD-10-CM

## 2018-09-25 DIAGNOSIS — Z114 Encounter for screening for human immunodeficiency virus [HIV]: Secondary | ICD-10-CM | POA: Diagnosis not present

## 2018-09-25 DIAGNOSIS — Z13220 Encounter for screening for lipoid disorders: Secondary | ICD-10-CM

## 2018-09-25 DIAGNOSIS — M160 Bilateral primary osteoarthritis of hip: Secondary | ICD-10-CM | POA: Insufficient documentation

## 2018-09-25 DIAGNOSIS — M25551 Pain in right hip: Secondary | ICD-10-CM | POA: Diagnosis not present

## 2018-09-25 DIAGNOSIS — M19011 Primary osteoarthritis, right shoulder: Secondary | ICD-10-CM | POA: Diagnosis not present

## 2018-09-25 DIAGNOSIS — Z130 Encounter for screening for diseases of the blood and blood-forming organs and certain disorders involving the immune mechanism: Secondary | ICD-10-CM

## 2018-09-25 DIAGNOSIS — R7982 Elevated C-reactive protein (CRP): Secondary | ICD-10-CM

## 2018-09-25 MED ORDER — PREDNISONE 20 MG PO TABS: 20.0000 mg | ORAL_TABLET | Freq: Every day | ORAL | 0 refills | Status: DC

## 2018-09-25 NOTE — Progress Notes (Signed)
Patient: Alexis Newman Female    DOB: 1956-09-19   62 y.o.   MRN: [id number] Visit Date: 09/28/2018  Today's Provider: Trinna Post, PA-C   Chief Complaint  Patient presents with  . Muscle Pain   Subjective:    HPI Patient here today c/o worsening muscle/soreness x's 1 month. Patient reports pain is affecting hip pain, low back, shoulders and hands. Symptoms are worse in the morning and with inactivity. Takes up to two hours in the morning. Has tried ibuprofen and acetaminophen which is not helping. In the morning has difficulty climbing steps and difficulty raising arms above head in the morning. Doing home improvement projects in home and initially thought symptoms were due to this. Stiffness in the morning is the worst in the first two hours of the morning but is consistent throughout the day. Difficulty washing hair in the shower. Family history of rheumatoid arthritis: none. She denies headache, jaw claudication, scalp sensitivity. She denies swollen or boggy joints.      No Known Allergies   Current Outpatient Medications:  .  acetaminophen (TYLENOL) 500 MG tablet, Take 1 tablet (500 mg total) by mouth every 6 (six) hours as needed., Disp: 120 tablet, Rfl: 3 .  cholecalciferol (VITAMIN D) 1000 units tablet, Take 1 tablet (1,000 Units total) by mouth daily., Disp: 90 tablet, Rfl: 3 .  levothyroxine (SYNTHROID, LEVOTHROID) 112 MCG tablet, TAKE 1 TABLET(112MCG TOTAL) BY MOUTH DAILY, Disp: 90 tablet, Rfl: 3 .  omeprazole (PRILOSEC) 40 MG capsule, TAKE ONE CAPSULE BY MOUTH DAILY; NEED APPOINTMENT BEFORE MORE REFILLS, Disp: 90 capsule, Rfl: 1 .  conjugated estrogens (PREMARIN) vaginal cream, Place 1 Applicatorful vaginally daily. (Patient not taking: Reported on 09/25/2018), Disp: 42.5 g, Rfl: 12 .  predniSONE (DELTASONE) 20 MG tablet, Take 1 tablet (20 mg total) by mouth daily with breakfast for 7 days., Disp: 7 tablet, Rfl: 0 .  triamcinolone cream (KENALOG) 0.1 %, Apply 1  application topically 2 (two) times daily. (Patient not taking: Reported on 09/25/2018), Disp: 30 g, Rfl: 0  Review of Systems  Musculoskeletal: Positive for arthralgias, back pain and myalgias.    Social History   Tobacco Use  . Smoking status: Never Smoker  . Smokeless tobacco: Never Used  Substance Use Topics  . Alcohol use: Yes    Alcohol/week: 2.0 standard drinks    Types: 2 Cans of beer per week    Comment: Occasionally   Objective:   BP (!) 152/96 (BP Location: Left Arm, Patient Position: Sitting, Cuff Size: Large)   Pulse 61   Temp 97.7 F (36.5 C) (Oral)   Resp 16   Wt 211 lb 9.6 oz (96 kg)   SpO2 99%   BMI 34.15 kg/m  Vitals:   09/25/18 1431  BP: (!) 152/96  Pulse: 61  Resp: 16  Temp: 97.7 F (36.5 C)  TempSrc: Oral  SpO2: 99%  Weight: 211 lb 9.6 oz (96 kg)     Physical Exam  Constitutional: She is oriented to person, place, and time. She appears well-developed and well-nourished.  Cardiovascular: Normal rate and regular rhythm.  Pulmonary/Chest: Effort normal and breath sounds normal.  Musculoskeletal: Normal range of motion. She exhibits no edema.  No impingement signs. 5/5 strength all four extremities.   Neurological: She is alert and oriented to person, place, and time.  Skin: Skin is warm and dry.  Psychiatric: She has a normal mood and affect. Her behavior is normal.  Assessment & Plan:     1. Chronic pain of both shoulders  Chronic issue and worsening. Will assess for osteoarthritis with plain films, which I suspect she has some but do think her symptoms are more consistent with polymyalgia rheumatica. Will start her on course of prednisone and refer to rheumatology.   - DG Shoulder Right; Future - DG Shoulder Left; Future - Sed Rate (ESR) - C-reactive protein - Rheumatoid factor - Cyclic citrul peptide antibody, IgG - predniSONE (DELTASONE) 20 MG tablet; Take 1 tablet (20 mg total) by mouth daily with breakfast for 7 days.   Dispense: 7 tablet; Refill: 0 - Ambulatory referral to Rheumatology  2. Chronic pain of both hips  - DG HIP UNILAT WITH PELVIS 2-3 VIEWS LEFT; Future - DG HIP UNILAT WITH PELVIS 2-3 VIEWS RIGHT; Future - Sed Rate (ESR) - C-reactive protein - Rheumatoid factor - Cyclic citrul peptide antibody, IgG - predniSONE (DELTASONE) 20 MG tablet; Take 1 tablet (20 mg total) by mouth daily with breakfast for 7 days.  Dispense: 7 tablet; Refill: 0 - Ambulatory referral to Rheumatology  3. Encounter for screening for HIV  - HIV antibody (with reflex)  4. Need for hepatitis C screening test  - Hepatitis C antibody  5. Screening cholesterol level  - Lipid Profile  6. Screening for deficiency anemia  - CBC with Differential  7. Diabetes mellitus screening  - Comprehensive Metabolic Panel (CMET)  8. Thyroid disorder screening  - TSH  9. CRP elevated  - Ambulatory referral to Rheumatology  10. Elevated sed rate  - Ambulatory referral to Rheumatology  Return in about 1 week (around 10/02/2018).  The entirety of the information documented in the History of Present Illness, Review of Systems and Physical Exam were personally obtained by me. Portions of this information were initially documented by Lynford Humphrey, CMA and reviewed by me for thoroughness and accuracy.         Trinna Post, PA-C  [facility]

## 2018-09-27 ENCOUNTER — Telehealth: Payer: Self-pay

## 2018-09-27 LAB — CBC WITH DIFFERENTIAL/PLATELET
Basophils Absolute: 0 10*3/uL (ref 0.0–0.2)
Basos: 1 %
EOS (ABSOLUTE): 0.1 10*3/uL (ref 0.0–0.4)
Eos: 1 %
Hematocrit: 38.7 % (ref 34.0–46.6)
Hemoglobin: 12.9 g/dL (ref 11.1–15.9)
Immature Grans (Abs): 0 10*3/uL (ref 0.0–0.1)
Immature Granulocytes: 0 %
Lymphocytes Absolute: 2.2 10*3/uL (ref 0.7–3.1)
Lymphs: 26 %
MCH: 28.7 pg (ref 26.6–33.0)
MCHC: 33.3 g/dL (ref 31.5–35.7)
MCV: 86 fL (ref 79–97)
Monocytes Absolute: 0.5 10*3/uL (ref 0.1–0.9)
Monocytes: 6 %
Neutrophils Absolute: 5.5 10*3/uL (ref 1.4–7.0)
Neutrophils: 66 %
Platelets: 304 10*3/uL (ref 150–450)
RBC: 4.49 x10E6/uL (ref 3.77–5.28)
RDW: 12.8 % (ref 12.3–15.4)
WBC: 8.2 10*3/uL (ref 3.4–10.8)

## 2018-09-27 LAB — COMPREHENSIVE METABOLIC PANEL
ALT: 20 IU/L (ref 0–32)
AST: 18 IU/L (ref 0–40)
Albumin/Globulin Ratio: 1.7 (ref 1.2–2.2)
Albumin: 4.3 g/dL (ref 3.6–4.8)
Alkaline Phosphatase: 69 IU/L (ref 39–117)
BUN/Creatinine Ratio: 16 (ref 12–28)
BUN: 15 mg/dL (ref 8–27)
Bilirubin Total: 0.3 mg/dL (ref 0.0–1.2)
CO2: 24 mmol/L (ref 20–29)
Calcium: 9.6 mg/dL (ref 8.7–10.3)
Chloride: 100 mmol/L (ref 96–106)
Creatinine, Ser: 0.92 mg/dL (ref 0.57–1.00)
GFR calc Af Amer: 77 mL/min/{1.73_m2} (ref >59)
GFR calc non Af Amer: 66 mL/min/{1.73_m2} (ref >59)
Globulin, Total: 2.6 g/dL (ref 1.5–4.5)
Glucose: 79 mg/dL (ref 65–99)
Potassium: 4.4 mmol/L (ref 3.5–5.2)
Sodium: 141 mmol/L (ref 134–144)
Total Protein: 6.9 g/dL (ref 6.0–8.5)

## 2018-09-27 LAB — LIPID PANEL
Chol/HDL Ratio: 3.6 ratio (ref 0.0–4.4)
Cholesterol, Total: 204 mg/dL — ABNORMAL HIGH (ref 100–199)
HDL: 57 mg/dL (ref >39)
LDL Calculated: 124 mg/dL — ABNORMAL HIGH (ref 0–99)
Triglycerides: 115 mg/dL (ref 0–149)
VLDL Cholesterol Cal: 23 mg/dL (ref 5–40)

## 2018-09-27 LAB — CYCLIC CITRUL PEPTIDE ANTIBODY, IGG/IGA: Cyclic Citrullin Peptide Ab: 7 units (ref 0–19)

## 2018-09-27 LAB — RHEUMATOID FACTOR: Rhuematoid fact SerPl-aCnc: <10 IU/mL (ref 0.0–13.9)

## 2018-09-27 LAB — C-REACTIVE PROTEIN: CRP: 36 mg/L — ABNORMAL HIGH (ref 0–10)

## 2018-09-27 LAB — SEDIMENTATION RATE: Sed Rate: 77 mm/hr — ABNORMAL HIGH (ref 0–40)

## 2018-09-27 LAB — HIV ANTIBODY (ROUTINE TESTING W REFLEX): HIV Screen 4th Generation wRfx: NONREACTIVE

## 2018-09-27 LAB — HEPATITIS C ANTIBODY: Hep C Virus Ab: <0.1 s/co ratio (ref 0.0–0.9)

## 2018-09-27 LAB — TSH: TSH: 0.597 u[IU]/mL (ref 0.450–4.500)

## 2018-09-27 NOTE — Telephone Encounter (Signed)
-----   Message from Trey Sailors, [place] sent at 09/27/2018  9:47 AM EDT ----- Inflammatory markers are elevated, but rhematoid tests are negative. Her xrays show arthritis in all four joints though her inflammatory markers are elevated and I think her pain is due to a rheumatologic disorder versus just regular osteoarthritis. Please follow up with rhemautology.

## 2018-09-27 NOTE — Telephone Encounter (Signed)
Pt returned missed call for lab results. Please call pt back to disclose results.  Thanks, [employer]

## 2018-09-27 NOTE — Telephone Encounter (Signed)
lmtcb

## 2018-09-27 NOTE — Telephone Encounter (Signed)
Patient advised as below. Patient verbalizes understanding and is in agreement with treatment plan.  

## 2018-09-28 NOTE — Patient Instructions (Signed)
Polymyalgia Rheumatica Polymyalgia rheumatica (PMR) is an inflammatory disorder that causes aching and stiffness in your muscles and joints. Sometimes, PMR leads to a more dangerous condition (temporal arteritis or giant cell arteritis), which can cause vision loss. What are the causes? The exact cause of PMR is not known. What increases the risk? This condition is more likely to develop in:  Females.  People who are 63 years of age or older.  Caucasians.  What are the signs or symptoms?  Pain and stiffness are the main symptoms of PMR. Symptoms may start slowly or suddenly. The symptoms:  May be worse after inactivity and in the morning.  May affect your: ? Hips, buttocks, and thighs. ? Neck, arms, and shoulders. This can make it hard to raise your arms above your head. ? Hands and wrists.  Other symptoms include:  Fever.  Tiredness.  Weakness.  Decreased appetite. This may lead to weight loss.  How is this diagnosed? This condition is diagnosed with a medical history and physical exam. You may need to see a health care provider who specializes in diseases of the joint, muscles, and bones (rheumatologist). You may also have tests, including:  Blood tests.  X-rays.  How is this treated? PMR usually goes away without treatment, but it may take years for that to happen. In the meantime, your health care provider may recommend low-dose steroids to help manage your symptoms of pain and stiffness. Regular exercise and rest will also help your symptoms. Follow these instructions at home:  Take over-the-counter and prescription medicines only as told by your health care provider.  Make sure to get enough rest and sleep.  Eat a healthy and nutritious diet.  Try to exercise most days of the week. Ask your health care provider what type of exercise is best for you.  Keep all follow-up visits as told by your health are provider. This is important. Contact a health care  provider if:  Your symptoms are not controlled with medicine.  You have side effects from steroids. These may include: ? Weight gain. ? Swelling. ? Insomnia. ? Mood changes. ? Bruising. ? High blood sugar readings, if you have diabetes. ? Higher than normal blood pressure readings, if you monitor your blood pressure. Get help right away if:  You develop symptoms of temporal arteritis, such as: ? A change in vision. ? Severe headache. ? Scalp pain. ? Jaw pain. This information is not intended to replace advice given to you by your health care provider. Make sure you discuss any questions you have with your health care provider. Document Released: 12/22/2004 Document Revised: 04/21/2016 Document Reviewed: 05/27/2015 Elsevier Interactive Patient Education  2018 Elsevier Inc.  

## 2018-10-01 ENCOUNTER — Telehealth: Payer: Self-pay | Admitting: Physician Assistant

## 2018-10-01 DIAGNOSIS — M25512 Pain in left shoulder: Principal | ICD-10-CM

## 2018-10-01 DIAGNOSIS — M25551 Pain in right hip: Secondary | ICD-10-CM

## 2018-10-01 DIAGNOSIS — M25552 Pain in left hip: Secondary | ICD-10-CM

## 2018-10-01 DIAGNOSIS — M25511 Pain in right shoulder: Principal | ICD-10-CM

## 2018-10-01 DIAGNOSIS — G8929 Other chronic pain: Secondary | ICD-10-CM

## 2018-10-01 MED ORDER — PREDNISONE 20 MG PO TABS: 20.0000 mg | ORAL_TABLET | Freq: Every day | ORAL | 0 refills | Status: DC

## 2018-10-01 NOTE — Telephone Encounter (Signed)
Please Review

## 2018-10-01 NOTE — Telephone Encounter (Signed)
refilled 

## 2018-10-01 NOTE — Telephone Encounter (Signed)
Pt wants to know if she can get a refill on the prednisone.  She is waiting on a referral to a rheumatology.    [facility] uses [facility]  CB#  [phone]

## 2018-10-02 ENCOUNTER — Ambulatory Visit: Payer: Self-pay | Admitting: Physician Assistant

## 2018-10-08 ENCOUNTER — Telehealth: Payer: Self-pay | Admitting: Physician Assistant

## 2018-10-08 DIAGNOSIS — M25512 Pain in left shoulder: Principal | ICD-10-CM

## 2018-10-08 DIAGNOSIS — G8929 Other chronic pain: Secondary | ICD-10-CM

## 2018-10-08 DIAGNOSIS — M25511 Pain in right shoulder: Principal | ICD-10-CM

## 2018-10-08 DIAGNOSIS — R700 Elevated erythrocyte sedimentation rate: Secondary | ICD-10-CM

## 2018-10-08 MED ORDER — PREDNISONE 10 MG PO TABS: 15.0000 mg | ORAL_TABLET | Freq: Every day | ORAL | 0 refills | Status: AC

## 2018-10-08 NOTE — Telephone Encounter (Signed)
Pt contacted office for refill request on the following medications:  predniSONE (DELTASONE) 20 MG tablet  [facility]  Pt stated that she doesn't have an appt with the rheumatologist until 10/18/18 and she is requesting a refill on the medication to last her until that appt. Pt stated that it has really been helping. Please advise. Thanks TNP

## 2018-10-08 NOTE — Telephone Encounter (Signed)
I sent in 15 mg daily for 10 days. If she has a flexible schedule, she might call the clinic and ask to be put on the cancellation list as they are pretty common.

## 2018-10-08 NOTE — Telephone Encounter (Signed)
Please review. Ok to refill?  

## 2018-10-09 NOTE — Telephone Encounter (Signed)
Patient advised as below.  

## 2018-10-17 DIAGNOSIS — M353 Polymyalgia rheumatica: Secondary | ICD-10-CM | POA: Insufficient documentation

## 2018-10-17 DIAGNOSIS — Z7952 Long term (current) use of systemic steroids: Secondary | ICD-10-CM | POA: Diagnosis not present

## 2018-10-17 DIAGNOSIS — E559 Vitamin D deficiency, unspecified: Secondary | ICD-10-CM | POA: Diagnosis not present

## 2018-10-31 ENCOUNTER — Other Ambulatory Visit: Payer: BLUE CROSS/BLUE SHIELD

## 2018-10-31 DIAGNOSIS — Z7184 Encounter for health counseling related to travel: Secondary | ICD-10-CM

## 2018-11-01 LAB — MEASLES/MUMPS/RUBELLA IMMUNITY
MUMPS ABS, IGG: 128 AU/mL (ref >10.9)
Rubella Antibodies, IGG: 21.6 index (ref >0.99)

## 2018-11-02 ENCOUNTER — Telehealth: Payer: Self-pay

## 2018-11-02 NOTE — Telephone Encounter (Signed)
Contacted patient via email to let her know to [facility] due to the Polymyalgia Rheumatica Syndrome.

## 2018-11-15 DIAGNOSIS — Z7952 Long term (current) use of systemic steroids: Secondary | ICD-10-CM | POA: Diagnosis not present

## 2018-11-15 DIAGNOSIS — M353 Polymyalgia rheumatica: Secondary | ICD-10-CM | POA: Diagnosis not present

## 2018-12-20 DIAGNOSIS — Z7952 Long term (current) use of systemic steroids: Secondary | ICD-10-CM | POA: Diagnosis not present

## 2018-12-20 DIAGNOSIS — M353 Polymyalgia rheumatica: Secondary | ICD-10-CM | POA: Diagnosis not present

## 2018-12-26 ENCOUNTER — Other Ambulatory Visit: Payer: Self-pay | Admitting: Physician Assistant

## 2018-12-26 DIAGNOSIS — E069 Thyroiditis, unspecified: Secondary | ICD-10-CM

## 2018-12-26 MED ORDER — LEVOTHYROXINE SODIUM 112 MCG PO TABS: ORAL_TABLET | ORAL | 3 refills | Status: DC

## 2018-12-26 NOTE — Telephone Encounter (Signed)
L.O.V. was 09/25/2018 please advise.

## 2018-12-26 NOTE — Telephone Encounter (Signed)
[facility] faxed refill request for the following medications:  levothyroxine (SYNTHROID, LEVOTHROID) 112 MCG tablet   Please advise.

## 2019-01-28 ENCOUNTER — Other Ambulatory Visit: Payer: Self-pay | Admitting: Physician Assistant

## 2019-01-28 DIAGNOSIS — E069 Thyroiditis, unspecified: Secondary | ICD-10-CM

## 2019-02-01 DIAGNOSIS — M353 Polymyalgia rheumatica: Secondary | ICD-10-CM | POA: Diagnosis not present

## 2019-02-01 DIAGNOSIS — Z7952 Long term (current) use of systemic steroids: Secondary | ICD-10-CM | POA: Diagnosis not present

## 2019-04-10 DIAGNOSIS — M353 Polymyalgia rheumatica: Secondary | ICD-10-CM | POA: Diagnosis not present

## 2019-04-20 ENCOUNTER — Other Ambulatory Visit: Payer: Self-pay | Admitting: Physician Assistant

## 2019-04-20 DIAGNOSIS — K219 Gastro-esophageal reflux disease without esophagitis: Secondary | ICD-10-CM

## 2019-04-23 NOTE — Telephone Encounter (Signed)
Please review

## 2019-05-08 DIAGNOSIS — M353 Polymyalgia rheumatica: Secondary | ICD-10-CM | POA: Diagnosis not present

## 2019-05-08 DIAGNOSIS — Z7952 Long term (current) use of systemic steroids: Secondary | ICD-10-CM | POA: Diagnosis not present

## 2019-05-22 ENCOUNTER — Ambulatory Visit (INDEPENDENT_AMBULATORY_CARE_PROVIDER_SITE_OTHER): Payer: BC Managed Care – PPO | Admitting: Physician Assistant

## 2019-05-22 ENCOUNTER — Encounter: Payer: Self-pay | Admitting: Physician Assistant

## 2019-05-22 DIAGNOSIS — M353 Polymyalgia rheumatica: Secondary | ICD-10-CM | POA: Diagnosis not present

## 2019-05-22 DIAGNOSIS — Z7952 Long term (current) use of systemic steroids: Secondary | ICD-10-CM | POA: Diagnosis not present

## 2019-05-22 DIAGNOSIS — E069 Thyroiditis, unspecified: Secondary | ICD-10-CM

## 2019-05-22 NOTE — Progress Notes (Signed)
Virtual Visit via Video Note  I connected with Alexis Newman on 05/22/19 at 10:40 AM EDT by a video enabled telemedicine application and verified that I am speaking with the correct person using two identifiers.  Location: Patient: Home Provider: [facility]   I discussed the limitations of evaluation and management by telemedicine and the availability of in person appointments. The patient expressed understanding and agreed to proceed.  Mar Daring, PA-C    Patient: Alexis Newman Female    DOB: 04/06/1956   63 y.o.   MRN: [id number] Visit Date: 05/22/2019  Today's Provider: Mar Daring, PA-C   No chief complaint on file.  Subjective:     HPI  Alexis Newman is a 63 yr old female that was seen today through Video visit. She had new complaint of hot flashes. Reports they are not that severe. They occur a few times per day. Worse if she has to wear a mask. She wanted to see if secondary to her PMR (sees Rheum) for which she is taking prednisone daily or possibly her thyroid. She has been on prednisone 20mg  for a while, and was reduced to prednisone 8mg  daily but her inflammatory markers started to rise again so the prednisone was increased back to 10mg  daily. She reports the hot flashes started just 2 weeks ago.   No Known Allergies   Current Outpatient Medications:  .  acetaminophen (TYLENOL) 500 MG tablet, Take 1 tablet (500 mg total) by mouth every 6 (six) hours as needed., Disp: 120 tablet, Rfl: 3 .  cholecalciferol (VITAMIN D) 1000 units tablet, Take 1 tablet (1,000 Units total) by mouth daily., Disp: 90 tablet, Rfl: 3 .  conjugated estrogens (PREMARIN) vaginal cream, Place 1 Applicatorful vaginally daily. (Patient not taking: Reported on 09/25/2018), Disp: 42.5 g, Rfl: 12 .  levothyroxine (SYNTHROID, LEVOTHROID) 112 MCG tablet, TAKE 1 TABLET(112MCG TOTAL) BY MOUTH DAILY, Disp: 90 tablet, Rfl: 3 .  omeprazole (PRILOSEC) 40 MG capsule, TAKE ONE CAPSULE BY MOUTH DAILY,  Disp: 90 capsule, Rfl: 1 .  triamcinolone cream (KENALOG) 0.1 %, Apply 1 application topically 2 (two) times daily. (Patient not taking: Reported on 09/25/2018), Disp: 30 g, Rfl: 0  Review of Systems  Constitutional: Positive for diaphoresis.  HENT: Negative.   Respiratory: Negative.   Cardiovascular: Negative.   Gastrointestinal: Negative.   Musculoskeletal: Positive for arthralgias (over all feeling well and improved greatly).  Neurological: Negative.     Social History   Tobacco Use  . Smoking status: Never Smoker  . Smokeless tobacco: Never Used  Substance Use Topics  . Alcohol use: Yes    Alcohol/week: 2.0 standard drinks    Types: 2 Cans of beer per week    Comment: Occasionally      Objective:   There were no vitals taken for this visit. There were no vitals filed for this visit.   Physical Exam Vitals signs reviewed.  Constitutional:      General: She is not in acute distress.    Appearance: Normal appearance. She is well-developed. She is not ill-appearing.  HENT:     Head: Normocephalic and atraumatic.  Neck:     Musculoskeletal: Normal range of motion and neck supple.  Pulmonary:     Effort: Pulmonary effort is normal. No respiratory distress.  Neurological:     Mental Status: She is alert.  Psychiatric:        Mood and Affect: Mood normal.  Behavior: Behavior normal.        Thought Content: Thought content normal.        Judgment: Judgment normal.      No results found for any visits on 05/22/19.     Assessment & Plan    1. Thyroiditis Feels she is stable on levothyroxine 112mcg. May consider adding TSH to labs done by rheumatology if hot flashes continue.   2. PMR (polymyalgia rheumatica) (HCC) Stable on prednisone 10mg . Followed by Rheumatology. Long term use of steroid does make her immune compromised. Should work from home as long as she is able to during the Covid pandemic.   3. Long term (current) use of systemic steroids See  above medical treatment plan.  I discussed the assessment and treatment plan with the patient. The patient was provided an opportunity to ask questions and all were answered. The patient agreed with the plan and demonstrated an understanding of the instructions.   The patient was advised to call back or seek an in-person evaluation if the symptoms worsen or if the condition fails to improve as anticipated.  I provided 14 minutes of non-face-to-face time during this encounter.   Margaretann LovelessJennifer M Zaryan Yakubov, PA-C  [facility]

## 2019-06-03 ENCOUNTER — Encounter: Payer: Self-pay | Admitting: Physician Assistant

## 2019-06-04 ENCOUNTER — Encounter: Payer: Self-pay | Admitting: Physician Assistant

## 2019-06-06 DIAGNOSIS — Z7952 Long term (current) use of systemic steroids: Secondary | ICD-10-CM | POA: Diagnosis not present

## 2019-06-06 DIAGNOSIS — M353 Polymyalgia rheumatica: Secondary | ICD-10-CM | POA: Diagnosis not present

## 2019-07-08 DIAGNOSIS — Z7952 Long term (current) use of systemic steroids: Secondary | ICD-10-CM | POA: Diagnosis not present

## 2019-07-08 DIAGNOSIS — M353 Polymyalgia rheumatica: Secondary | ICD-10-CM | POA: Diagnosis not present

## 2019-07-11 DIAGNOSIS — Z7952 Long term (current) use of systemic steroids: Secondary | ICD-10-CM | POA: Diagnosis not present

## 2019-07-11 DIAGNOSIS — M353 Polymyalgia rheumatica: Secondary | ICD-10-CM | POA: Diagnosis not present

## 2019-08-07 DIAGNOSIS — M353 Polymyalgia rheumatica: Secondary | ICD-10-CM | POA: Diagnosis not present

## 2019-08-07 DIAGNOSIS — Z7952 Long term (current) use of systemic steroids: Secondary | ICD-10-CM | POA: Diagnosis not present

## 2019-08-12 ENCOUNTER — Ambulatory Visit (INDEPENDENT_AMBULATORY_CARE_PROVIDER_SITE_OTHER): Payer: BC Managed Care – PPO | Admitting: Physician Assistant

## 2019-08-12 ENCOUNTER — Other Ambulatory Visit: Payer: Self-pay

## 2019-08-12 ENCOUNTER — Ambulatory Visit: Payer: BC Managed Care – PPO | Admitting: Physician Assistant

## 2019-08-12 DIAGNOSIS — Z23 Encounter for immunization: Secondary | ICD-10-CM

## 2019-08-12 NOTE — Progress Notes (Signed)
Flu vaccine given today without complication. Patient sat upright for 15 minutes to check for adverse reaction before being released. °

## 2019-09-13 DIAGNOSIS — Z7952 Long term (current) use of systemic steroids: Secondary | ICD-10-CM | POA: Diagnosis not present

## 2019-09-13 DIAGNOSIS — M353 Polymyalgia rheumatica: Secondary | ICD-10-CM | POA: Diagnosis not present

## 2019-10-04 ENCOUNTER — Other Ambulatory Visit: Payer: Self-pay | Admitting: Physician Assistant

## 2019-10-04 DIAGNOSIS — K219 Gastro-esophageal reflux disease without esophagitis: Secondary | ICD-10-CM

## 2019-10-18 DIAGNOSIS — M353 Polymyalgia rheumatica: Secondary | ICD-10-CM | POA: Diagnosis not present

## 2019-10-18 DIAGNOSIS — Z7952 Long term (current) use of systemic steroids: Secondary | ICD-10-CM | POA: Diagnosis not present

## 2019-11-01 IMAGING — CR DG SHOULDER 2+V*L*
1 series · 4 of 4 positions shown · non-contrast
Comparison: None.

CLINICAL DATA: Left shoulder pain. No known injury.

EXAM:
LEFT SHOULDER - 2+ VIEW

[Series 1: dg shoulder left · 0.14mm/px · 4 of 4 slices shown]
[im 1/4]
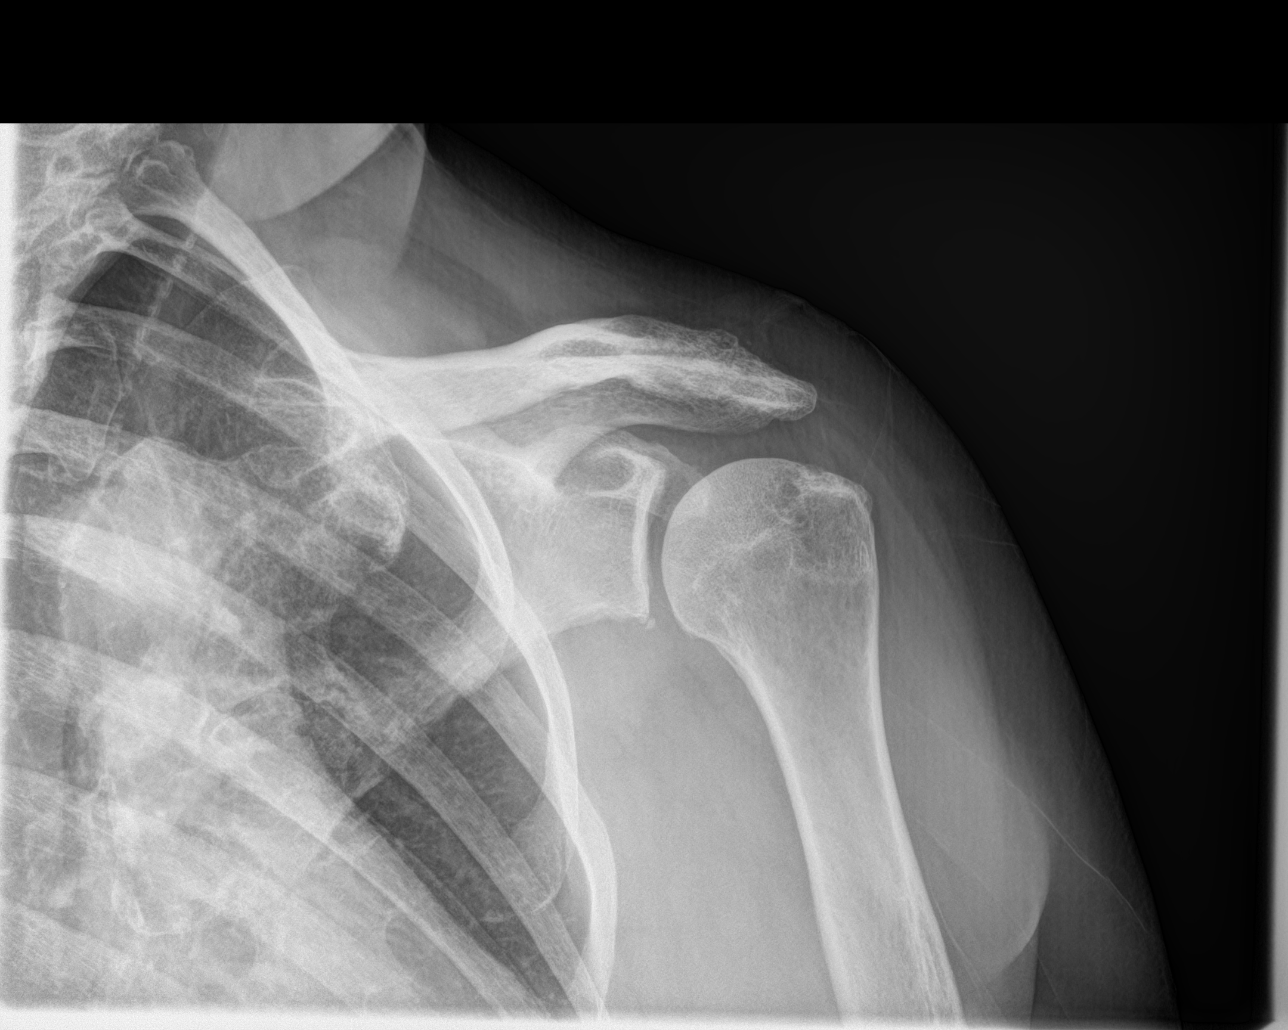
[im 2/4]
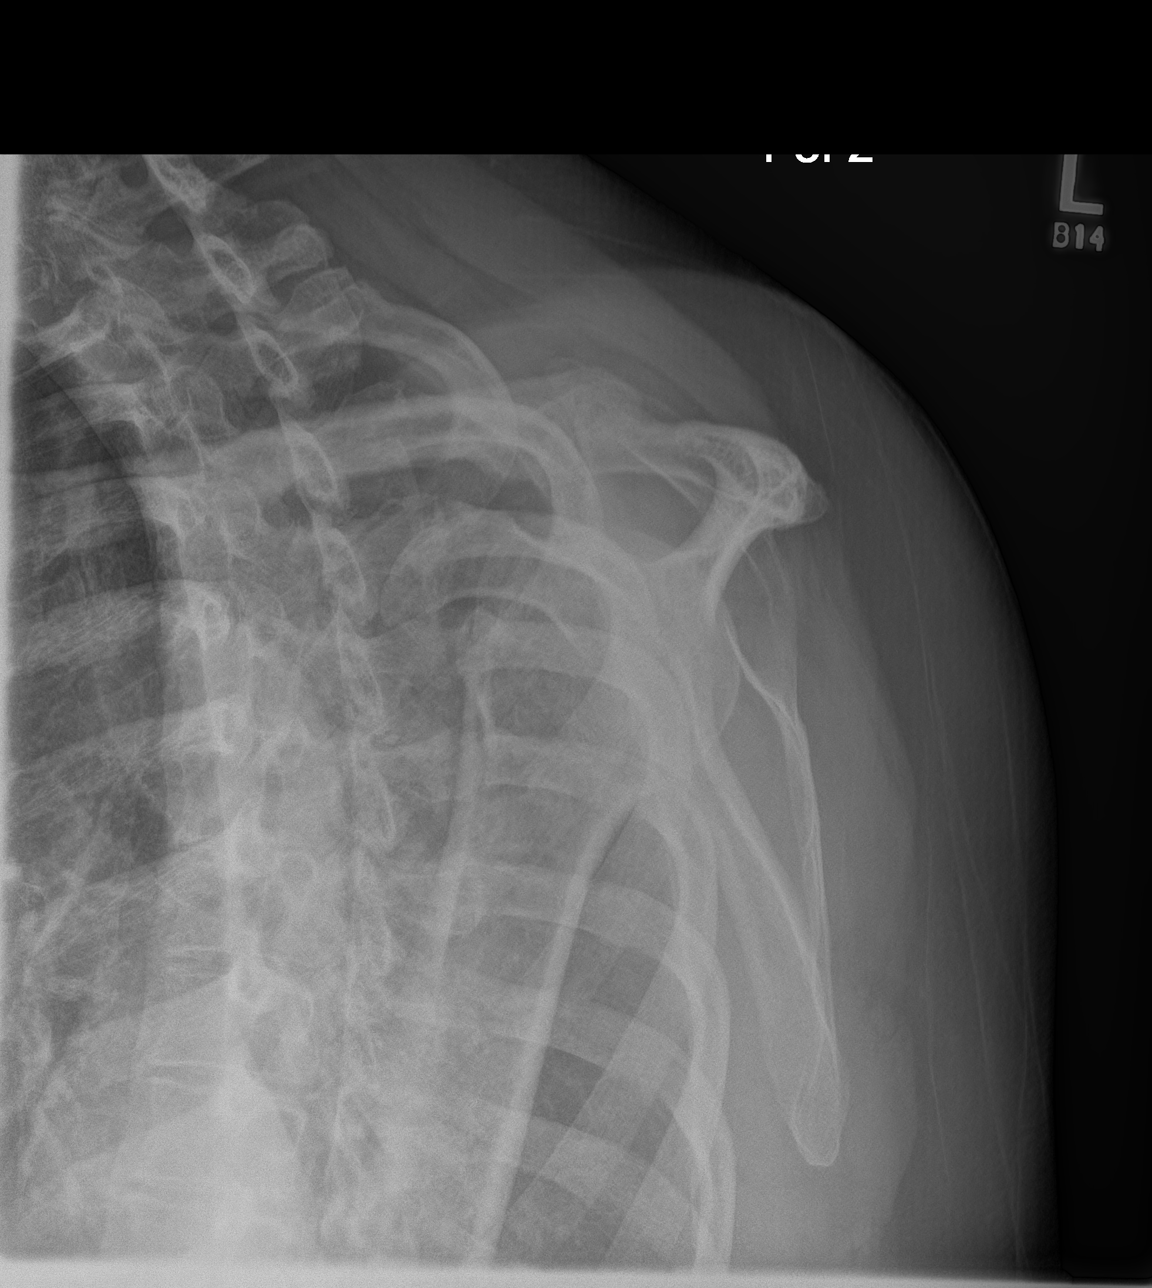
[im 3/4]
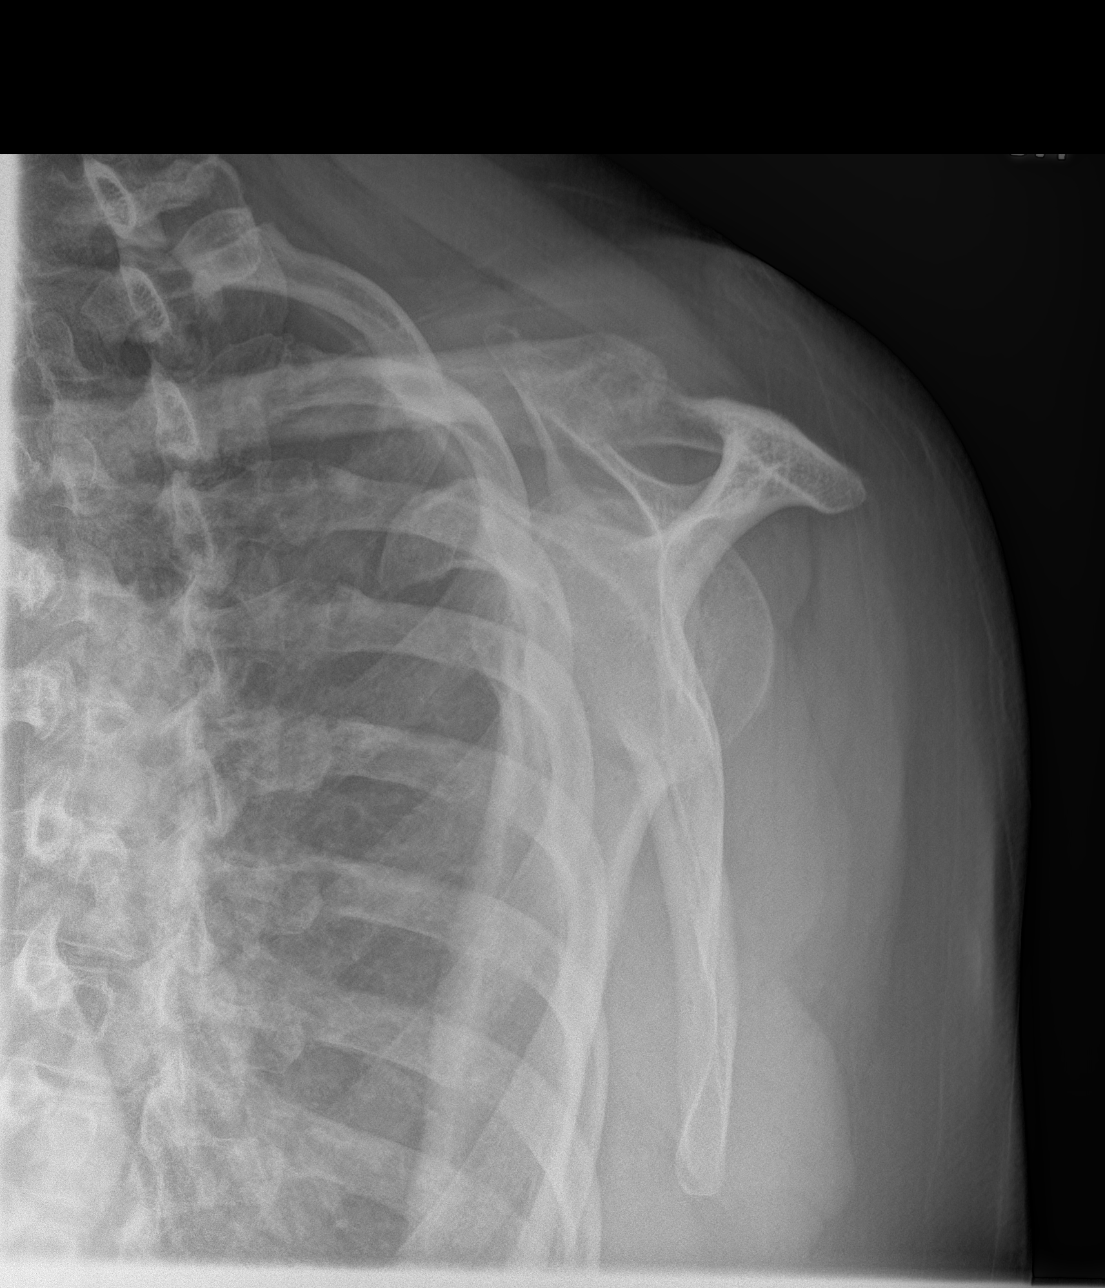
[im 4/4]
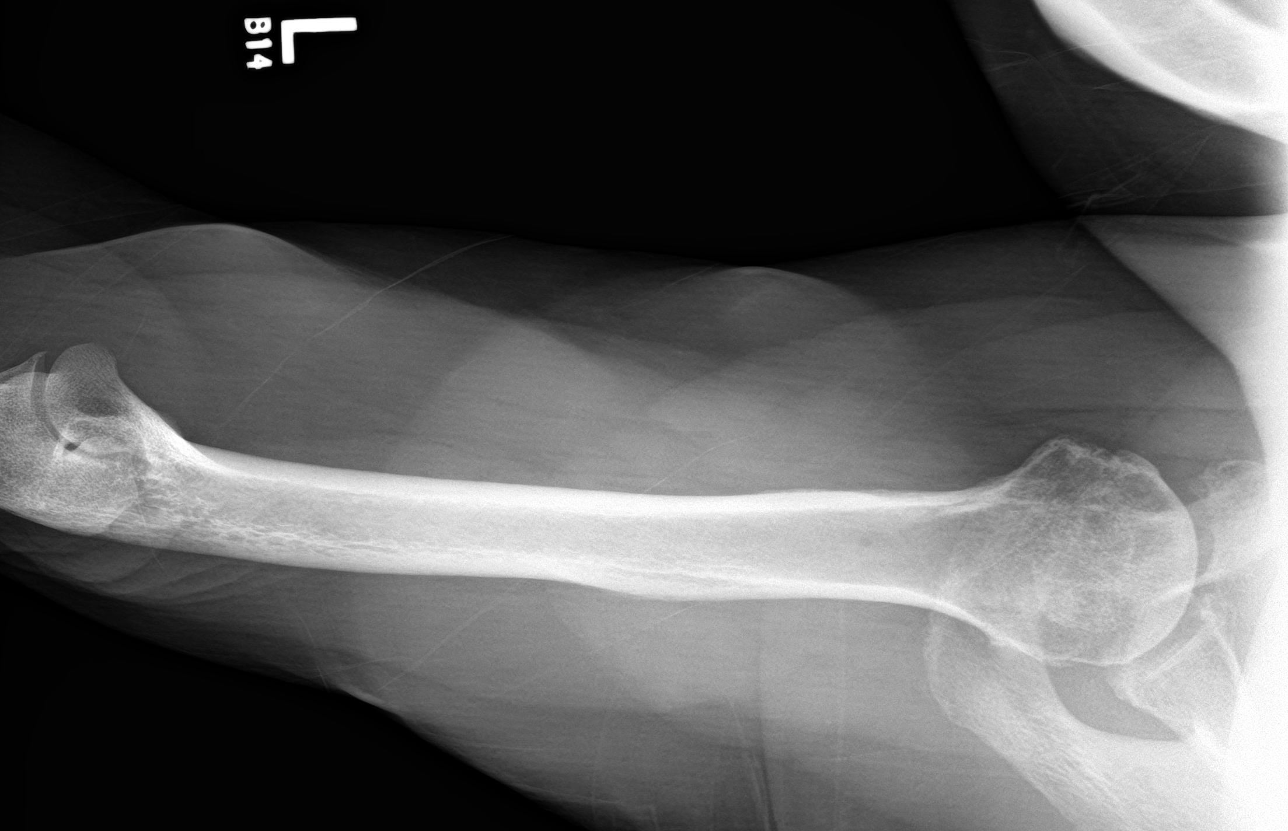

[4 of 4 positions shown; findings below may reference images not displayed]

FINDINGS: Minimal fragmented inferior glenohumeral spur formation. Mild
greater tuberosity spurring. No fracture or dislocation.
IMPRESSION: Mild degenerative changes.

## 2019-11-01 IMAGING — CR DG HIP (WITH OR WITHOUT PELVIS) 2-3V*R*
1 series · 4 of 4 positions shown · non-contrast
Comparison: None.

CLINICAL DATA: Bilateral hip pain. No known injury.

EXAM:
DG HIP (WITH OR WITHOUT PELVIS) 2-3V RIGHT

[Series 1: dg hip unilat w or w/o pelvis 2-3 views  · non-contrast · 0.14mm/px · 4 of 4 slices shown]
[im 1/4]
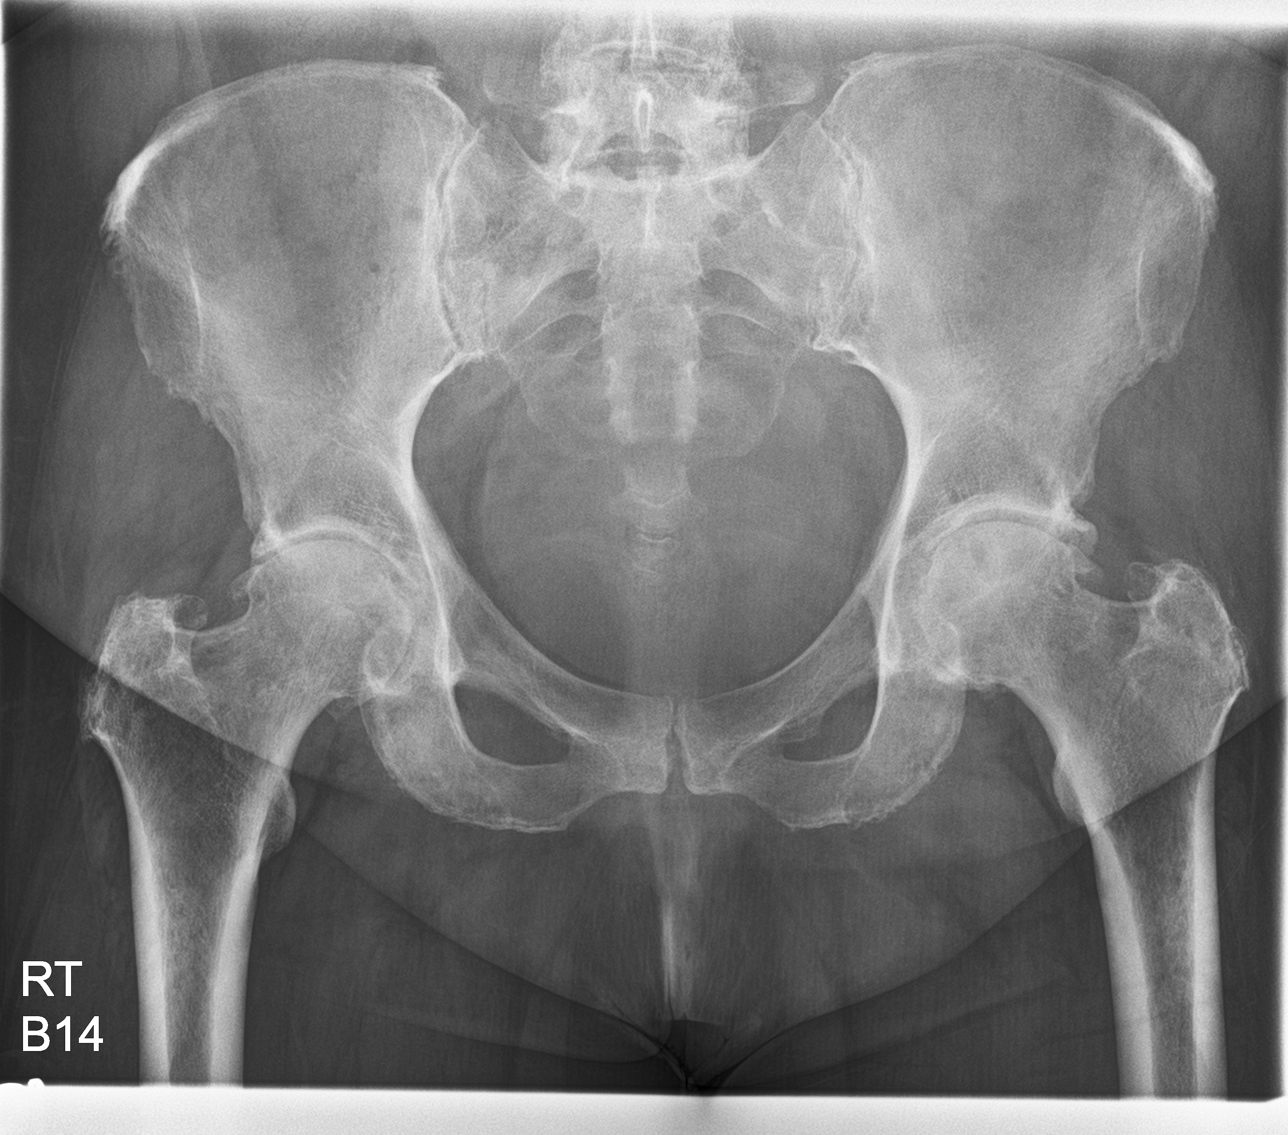
[im 2/4]
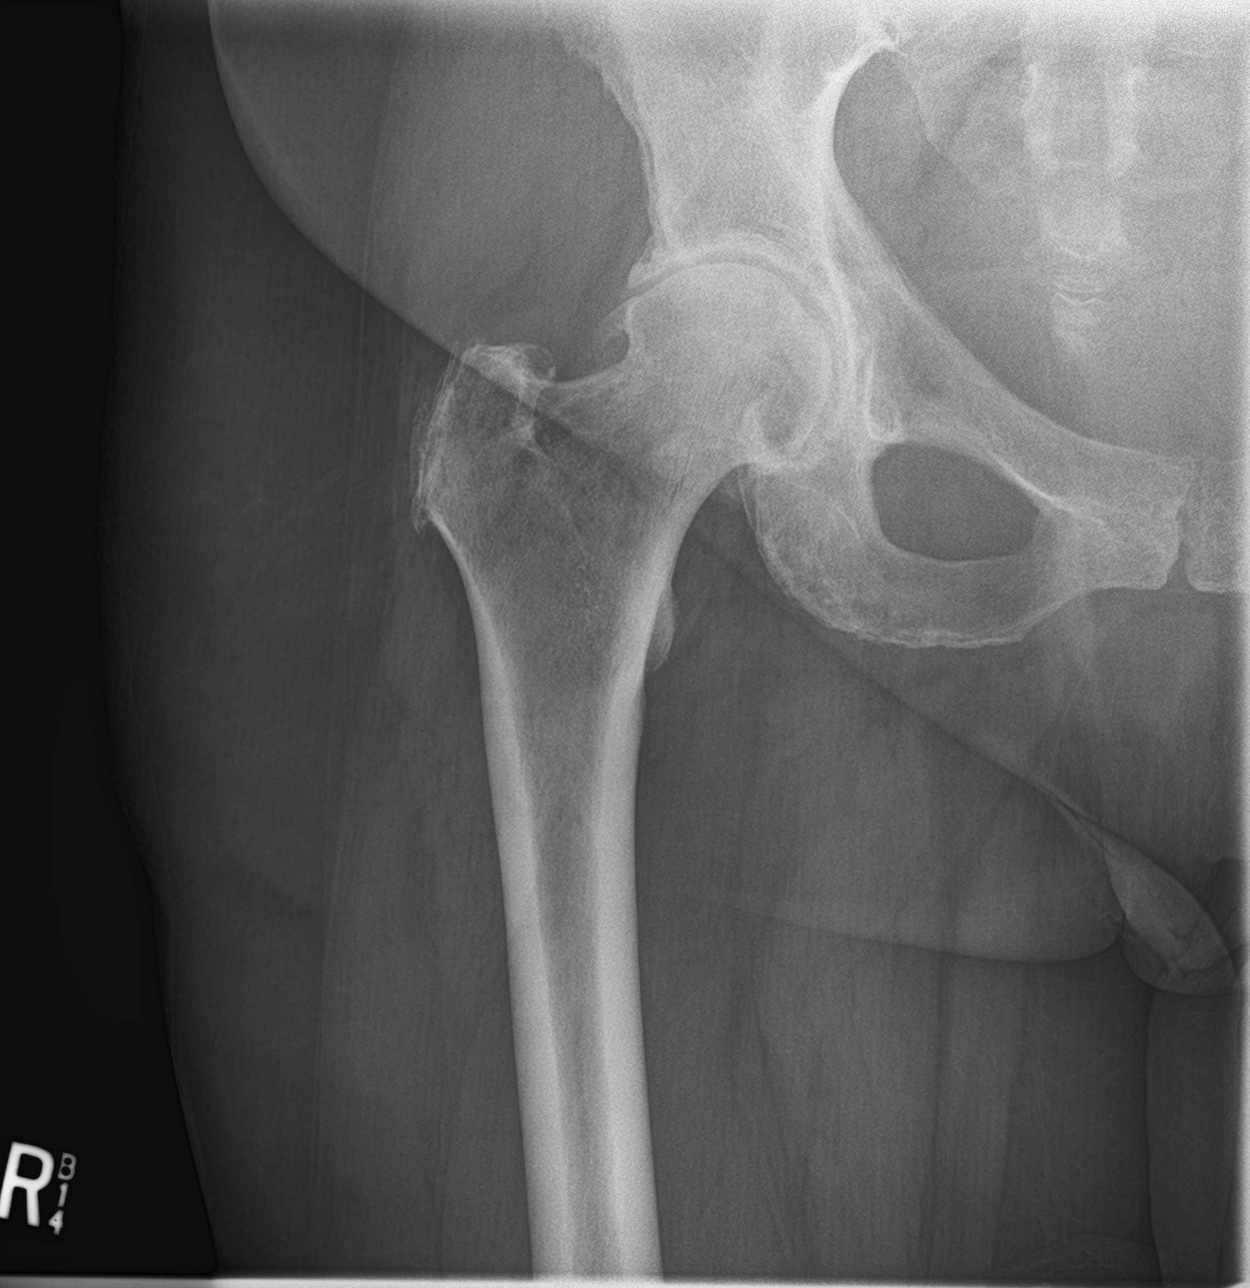
[im 3/4]
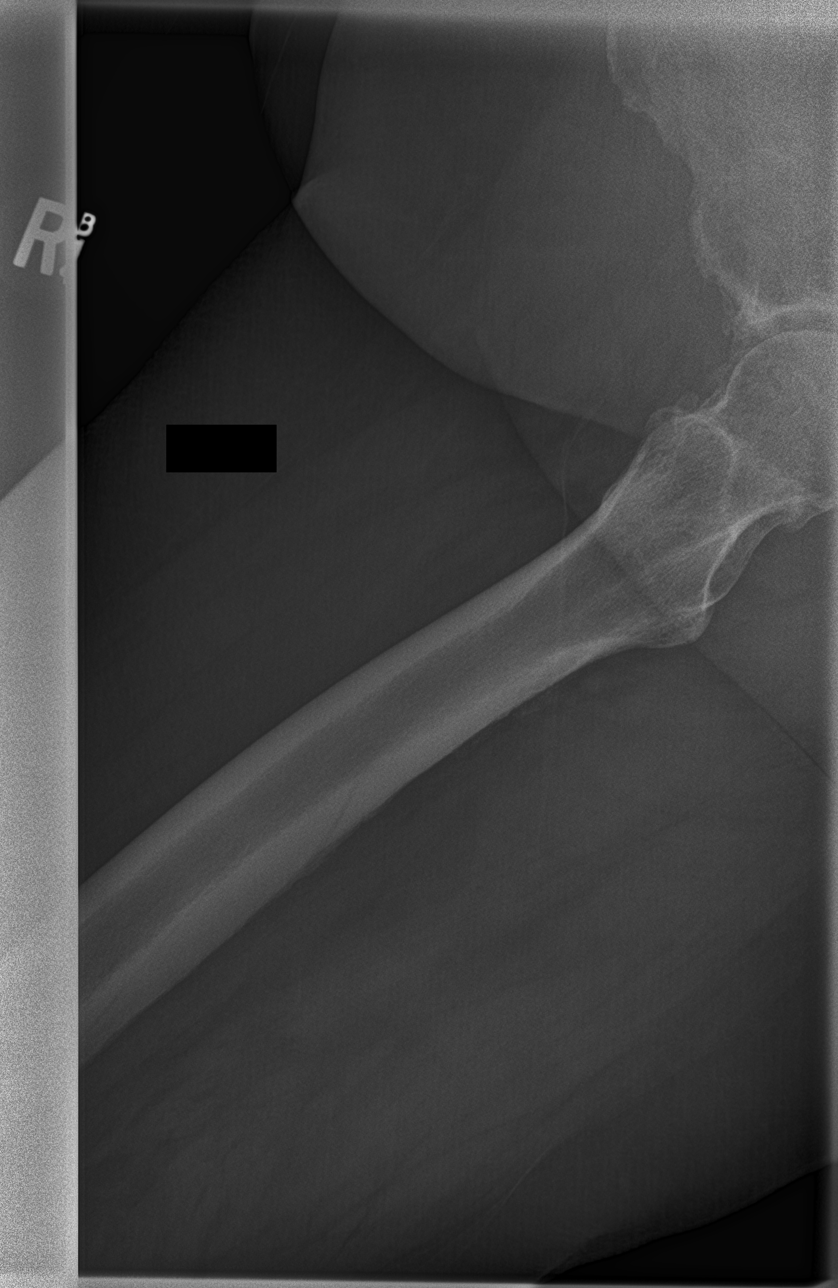
[im 4/4]
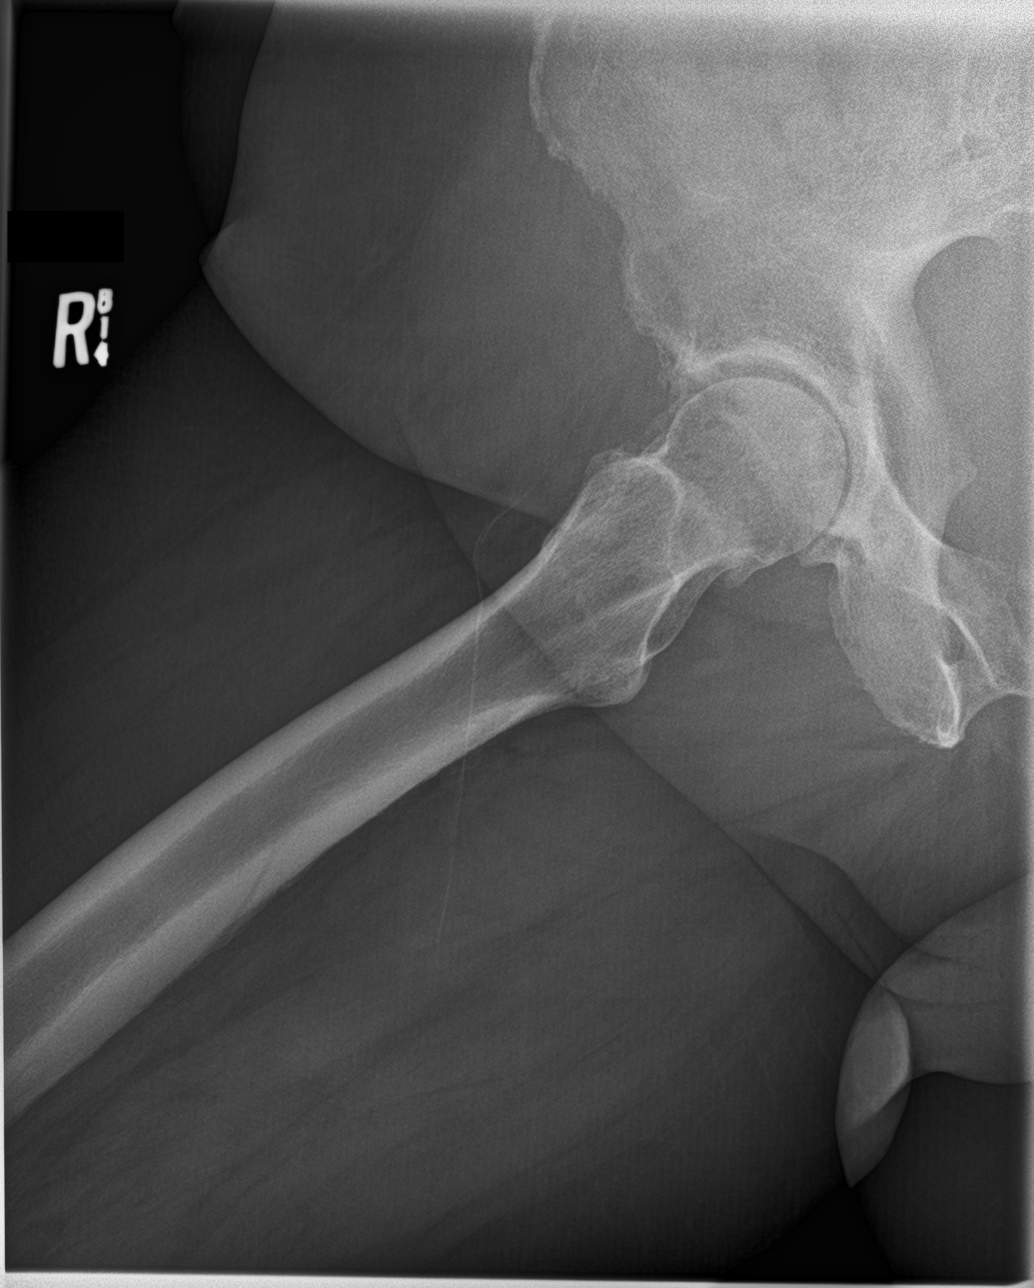

[4 of 4 positions shown; findings below may reference images not displayed]

FINDINGS: Moderate bilateral hip degenerative changes with joint space
narrowing and spur formation. No avascular necrosis seen.
IMPRESSION: Moderate bilateral hip degenerative changes.

## 2019-11-01 IMAGING — CR DG HIP (WITH OR WITHOUT PELVIS) 2-3V*L*
1 series · 4 of 4 positions shown · non-contrast
Comparison: None.

CLINICAL DATA: Pt states no injury, is having generalized pain in
both shoulders and both hips, no radiating pain in shoulders or hips
and pain is equal in all areas for 1 month. Pt states doctor is
checking for arthritis. shielded

EXAM:
DG HIP (WITH OR WITHOUT PELVIS) 2-3V LEFT

[Series 1: dg hip unilat w or w/o pelvis 2-3 views  · non-contrast · 0.14mm/px · 4 of 4 slices shown]
[im 1/4]
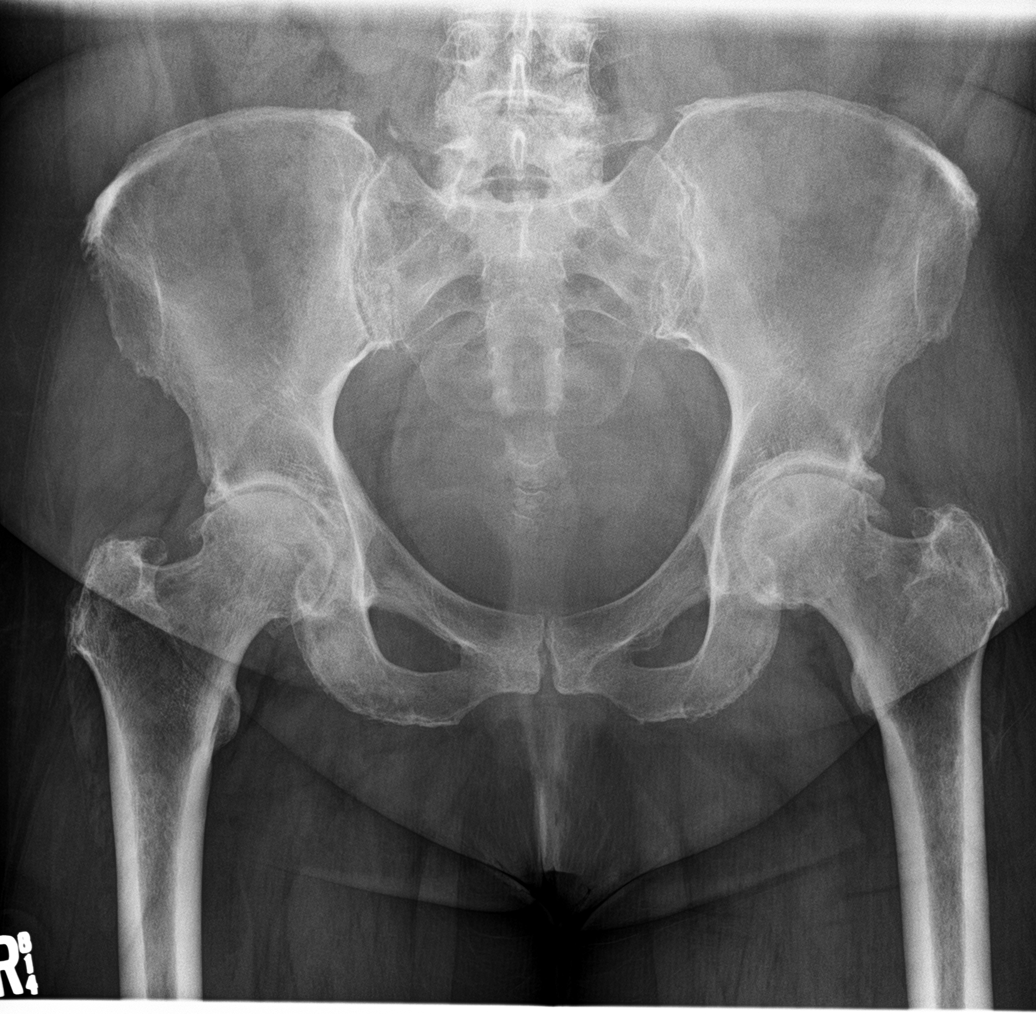
[im 2/4]
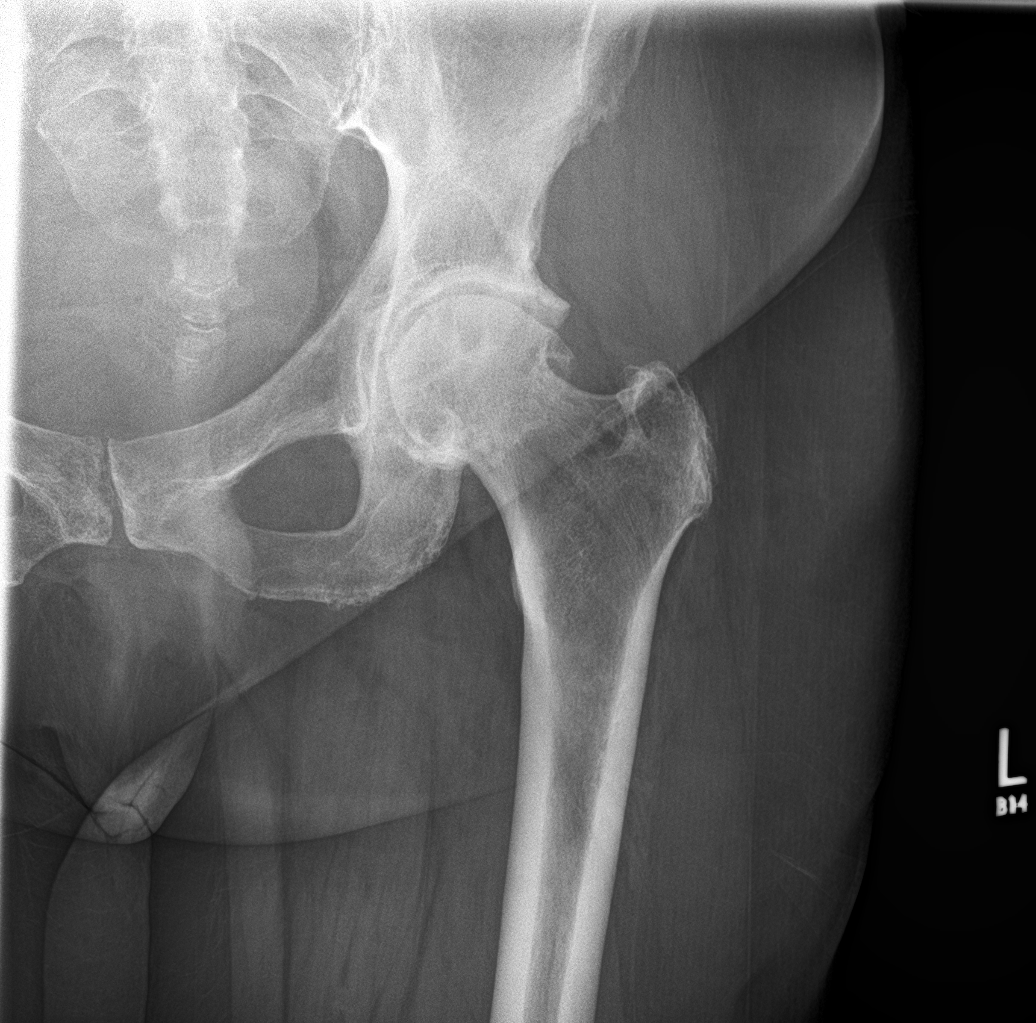
[im 3/4]
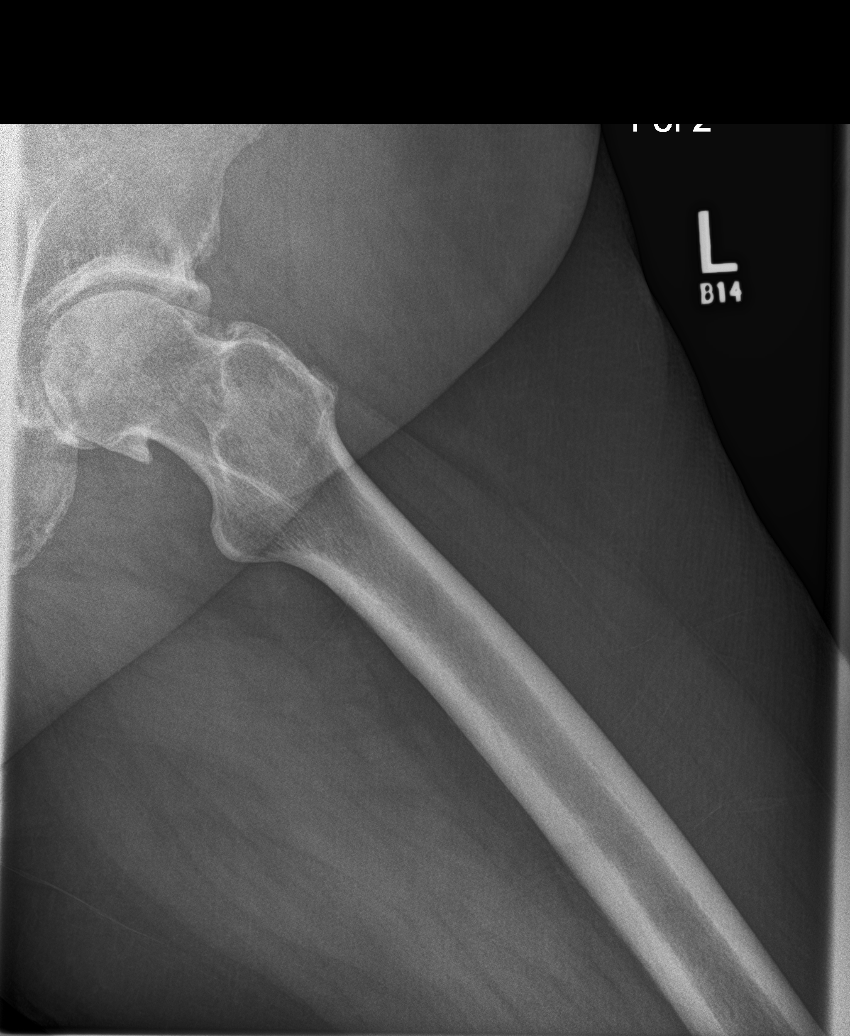
[im 4/4]
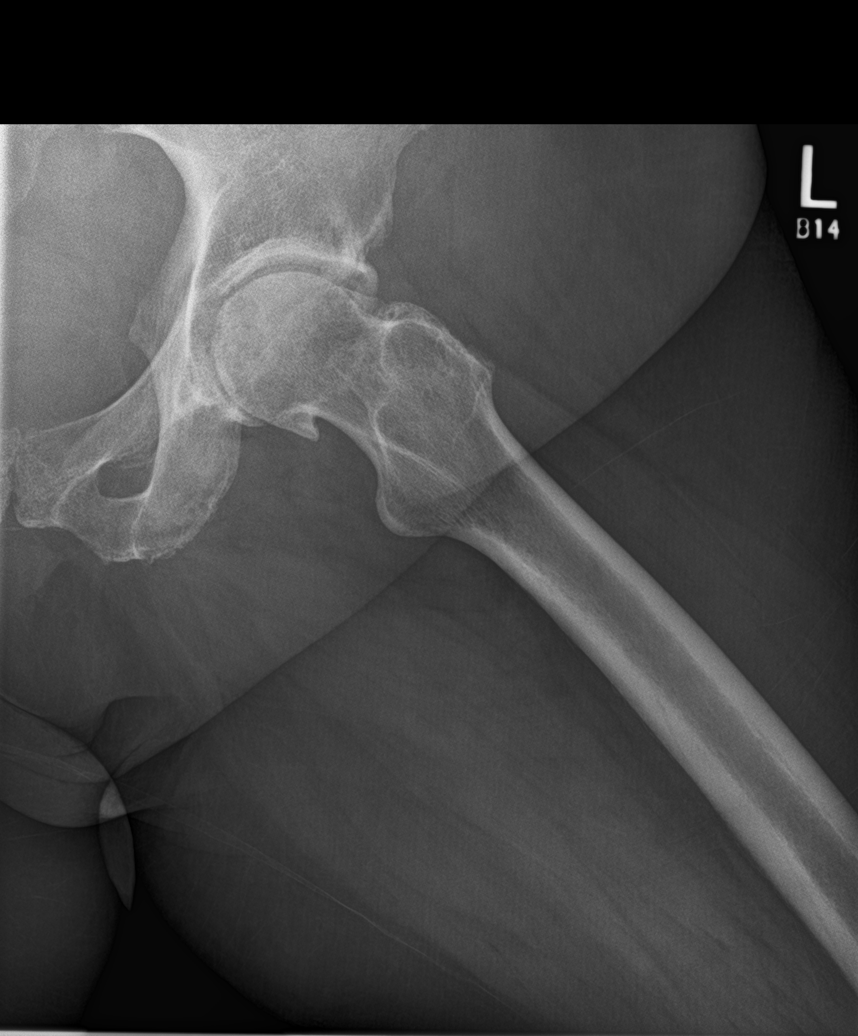

[4 of 4 positions shown; findings below may reference images not displayed]

FINDINGS: Moderate narrowing of the articular cartilage in both hips, right
worse than left, with marginal spurring from the femoral heads and
acetabula. No fracture or dislocation. Bony pelvis intact. Regional
soft tissues unremarkable.
IMPRESSION: 1. Moderately advanced bilateral hip DJD, right greater than left.

## 2019-11-13 DIAGNOSIS — M353 Polymyalgia rheumatica: Secondary | ICD-10-CM | POA: Diagnosis not present

## 2019-11-20 DIAGNOSIS — Z7952 Long term (current) use of systemic steroids: Secondary | ICD-10-CM | POA: Diagnosis not present

## 2019-11-20 DIAGNOSIS — M353 Polymyalgia rheumatica: Secondary | ICD-10-CM | POA: Diagnosis not present

## 2019-12-26 DIAGNOSIS — Z7952 Long term (current) use of systemic steroids: Secondary | ICD-10-CM | POA: Diagnosis not present

## 2019-12-26 DIAGNOSIS — M353 Polymyalgia rheumatica: Secondary | ICD-10-CM | POA: Diagnosis not present

## 2019-12-27 ENCOUNTER — Other Ambulatory Visit: Payer: Self-pay | Admitting: Physician Assistant

## 2019-12-27 DIAGNOSIS — E069 Thyroiditis, unspecified: Secondary | ICD-10-CM

## 2019-12-27 NOTE — Telephone Encounter (Signed)
Requested medication (s) are due for refill today: no  Requested medication (s) are on the active medication list: yes  Last refill:  01/28/2019  Future visit scheduled: no  Notes to clinic: overdue for labs and follow up   Requested Prescriptions  Pending Prescriptions Disp Refills   levothyroxine (SYNTHROID) 112 MCG tablet [Pharmacy Med Name: LEVOTHYROXINE 0.112MG  ( ) TABS] 90 tablet 3    Sig: TAKE 1 TABLET BY MOUTH DAILY      Endocrinology:  Hypothyroid Agents Failed - 12/27/2019  9:12 AM      Failed - TSH needs to be rechecked within 3 months after an abnormal result. Refill until TSH is due.      Failed - TSH in normal range and within 360 days    TSH  Date Value Ref Range Status  09/25/2018 0.597 0.450 - 4.500 uIU/mL Final          Passed - Valid encounter within last 12 months    Recent Outpatient Visits           7 months ago Thyroiditis   [facility] [place], [place], [place]   1 year ago Chronic pain of both shoulders   [facility] [place], [place], [place]   2 years ago Vaginal itching   [facility] [place], [place], [place]   3 years ago Gastroesophageal reflux disease, esophagitis presence not specified   [facility], [place], [place]   3 years ago Insomnia   [facility] Lorie Phenix, MD

## 2020-01-24 DIAGNOSIS — Z7952 Long term (current) use of systemic steroids: Secondary | ICD-10-CM | POA: Diagnosis not present

## 2020-01-24 DIAGNOSIS — M353 Polymyalgia rheumatica: Secondary | ICD-10-CM | POA: Diagnosis not present

## 2020-02-21 DIAGNOSIS — M353 Polymyalgia rheumatica: Secondary | ICD-10-CM | POA: Diagnosis not present

## 2020-02-21 DIAGNOSIS — Z7952 Long term (current) use of systemic steroids: Secondary | ICD-10-CM | POA: Diagnosis not present

## 2020-02-28 ENCOUNTER — Telehealth: Payer: Self-pay | Admitting: Physician Assistant

## 2020-02-28 DIAGNOSIS — E069 Thyroiditis, unspecified: Secondary | ICD-10-CM

## 2020-02-28 DIAGNOSIS — K219 Gastro-esophageal reflux disease without esophagitis: Secondary | ICD-10-CM

## 2020-02-28 MED ORDER — OMEPRAZOLE 40 MG PO CPDR: 40.0000 mg | DELAYED_RELEASE_CAPSULE | Freq: Every day | ORAL | 1 refills | Status: DC

## 2020-02-28 MED ORDER — LEVOTHYROXINE SODIUM 112 MCG PO TABS: ORAL_TABLET | ORAL | 3 refills | Status: DC

## 2020-02-28 NOTE — Telephone Encounter (Signed)
[facility] faxed refill request for the following medications: Fax - [phone]  Requesting 90 supply: omeprazole (PRILOSEC) 40 MG capsule L-thyroxine 112 mcg tabs    Please advise.  Thanks, [employer]

## 2020-03-12 DIAGNOSIS — M353 Polymyalgia rheumatica: Secondary | ICD-10-CM | POA: Diagnosis not present

## 2020-03-12 DIAGNOSIS — Z7952 Long term (current) use of systemic steroids: Secondary | ICD-10-CM | POA: Diagnosis not present

## 2020-03-20 ENCOUNTER — Other Ambulatory Visit: Payer: Self-pay | Admitting: Physician Assistant

## 2020-03-20 DIAGNOSIS — K219 Gastro-esophageal reflux disease without esophagitis: Secondary | ICD-10-CM

## 2020-03-27 DIAGNOSIS — M353 Polymyalgia rheumatica: Secondary | ICD-10-CM | POA: Diagnosis not present

## 2020-03-27 DIAGNOSIS — Z7952 Long term (current) use of systemic steroids: Secondary | ICD-10-CM | POA: Diagnosis not present

## 2020-04-16 ENCOUNTER — Other Ambulatory Visit: Payer: Self-pay

## 2020-04-16 ENCOUNTER — Encounter: Payer: Self-pay | Admitting: Podiatry

## 2020-04-16 ENCOUNTER — Ambulatory Visit: Payer: BC Managed Care – PPO | Admitting: Podiatry

## 2020-04-16 DIAGNOSIS — L608 Other nail disorders: Secondary | ICD-10-CM | POA: Diagnosis not present

## 2020-04-16 DIAGNOSIS — M79674 Pain in right toe(s): Secondary | ICD-10-CM | POA: Diagnosis not present

## 2020-04-16 DIAGNOSIS — B351 Tinea unguium: Secondary | ICD-10-CM

## 2020-04-16 NOTE — Progress Notes (Signed)
This patient presents the office today for an evaluation of the toenails on her right foot.  She says she has been doing her nails but she is unable to do the nails on her first and second toes right foot.  She states these nails are painful walking wearing her shoes.  She says she is decided to be seen by her professional to help with this ongoing nail problem.  Vascular  Dorsalis pedis and posterior tibial pulses are palpable  B/L.  Capillary return  WNL.  Temperature gradient is  WNL.  Skin turgor  WNL  Sensorium  Senn Weinstein monofilament wire  WNL. Normal tactile sensation.  Nail Exam  Patient has pincer toenails 1,2 right foot.    Orthopedic  Exam  Muscle tone and muscle strength  WNL.  No limitations of motion feet  B/L.  No crepitus or joint effusion noted.  Foot type is unremarkable and digits show no abnormalities.  Bony prominences are unremarkable.  Skin  No open lesions.  Normal skin texture and turgor.  Onychomycosis  1,2 right  Pincer nails  1,2 right foot.  IE  Debride pincer/onychomysosis nails  Right foot.  IE>  Debride nails  1,2 right foot.  RTC 10 weeks.  We will evaluate her nails at that time and discuss nail surgery.   Helane Gunther DPM

## 2020-05-20 DIAGNOSIS — Z7952 Long term (current) use of systemic steroids: Secondary | ICD-10-CM | POA: Diagnosis not present

## 2020-05-20 DIAGNOSIS — M353 Polymyalgia rheumatica: Secondary | ICD-10-CM | POA: Diagnosis not present

## 2020-06-15 ENCOUNTER — Encounter: Payer: Self-pay | Admitting: Physician Assistant

## 2020-06-15 DIAGNOSIS — M545 Low back pain, unspecified: Secondary | ICD-10-CM

## 2020-06-17 ENCOUNTER — Encounter: Payer: Self-pay | Admitting: Physician Assistant

## 2020-06-17 DIAGNOSIS — M62838 Other muscle spasm: Secondary | ICD-10-CM

## 2020-06-18 MED ORDER — BACLOFEN 10 MG PO TABS: 10.0000 mg | ORAL_TABLET | Freq: Three times a day (TID) | ORAL | 0 refills | Status: DC

## 2020-06-18 NOTE — Addendum Note (Signed)
Addended by: Margaretann Loveless on: 06/18/2020 08:48 AM   Modules accepted: Orders

## 2020-06-25 ENCOUNTER — Encounter: Payer: Self-pay | Admitting: Podiatry

## 2020-06-25 ENCOUNTER — Ambulatory Visit: Payer: BC Managed Care – PPO | Admitting: Podiatry

## 2020-06-25 ENCOUNTER — Other Ambulatory Visit: Payer: Self-pay

## 2020-06-25 DIAGNOSIS — L608 Other nail disorders: Secondary | ICD-10-CM

## 2020-06-25 DIAGNOSIS — M1120 Other chondrocalcinosis, unspecified site: Secondary | ICD-10-CM | POA: Insufficient documentation

## 2020-06-25 DIAGNOSIS — B351 Tinea unguium: Secondary | ICD-10-CM

## 2020-06-25 DIAGNOSIS — M79674 Pain in right toe(s): Secondary | ICD-10-CM | POA: Diagnosis not present

## 2020-06-25 NOTE — Progress Notes (Signed)
This patient presents the office today to discuss her first and second toenails on her right foot.  She was previously seen and diagnosed as having a fungal infection in addition to a pincer toenail deformity right big toe.  She says she went home and she researched pincer nails and she found a treatment for the pincer toenails called bracing.  She says she is not having any pain or discomfort and is not interested in surgical correction today.  She presents the office today to discuss her nails on her right foot.  Vascular  Dorsalis pedis and posterior tibial pulses are palpable  B/L.  Capillary return  WNL.  Temperature gradient is  WNL.  Skin turgor  WNL  Sensorium  Senn Weinstein monofilament wire  WNL. Normal tactile sensation.  Nail Exam  Patient has normal nails with no evidence of bacterial or fungal infection.with the exception 1,2 toenail right foot.  Patient has thick toenails 1,2  With marked incurvation noted medial border right hallux.  Orthopedic  Exam  Muscle tone and muscle strength  WNL.  No limitations of motion feet  B/L.  No crepitus or joint effusion noted.  Foot type is unremarkable and digits show no abnormalities.  Bony prominences are unremarkable.  Skin  No open lesions.  Normal skin texture and turgor.  Onychomycosis  Pincer nails  1,2 right foot.  ROV.  Discussed the nails on her right foot.  Told this patient I was unaware of the treatment that she research which is bracing.  I told her that I would check with one of the younger doctors in the practice to see if they were aware of bracing for this condition.  I will check with Dr. Allena Katz in the a.m. and call patient.  Helane Gunther DPM

## 2020-06-26 ENCOUNTER — Other Ambulatory Visit: Payer: Self-pay | Admitting: Physician Assistant

## 2020-06-26 DIAGNOSIS — M62838 Other muscle spasm: Secondary | ICD-10-CM

## 2020-06-26 MED ORDER — BACLOFEN 10 MG PO TABS: 10.0000 mg | ORAL_TABLET | Freq: Three times a day (TID) | ORAL | 5 refills | Status: DC

## 2020-06-26 NOTE — Telephone Encounter (Signed)
Medication: baclofen (LIORESAL) 10 MG tablet [[id number]] - can this medication be sent to the below pharmacy please? It was sent to the wrong pharmacy.  Has the patient contacted their pharmacy? YES  (Agent: If no, request that the patient contact the pharmacy for the refill.) (Agent: If yes, when and what did the pharmacy advise?)  Preferred Pharmacy (with phone number or street name): [id number] [address] [address]  Phone:  [phone] Fax:  [phone]     Agent: Please be advised that RX refills may take up to 3 business days. We ask that you follow-up with your pharmacy.

## 2020-06-26 NOTE — Telephone Encounter (Signed)
   Notes to clinic:  medication was sent to wrong pharmacy Please send to updated pharmacy    Requested Prescriptions  Pending Prescriptions Disp Refills   baclofen (LIORESAL) 10 MG tablet 30 each 0    Sig: Take 1 tablet (10 mg total) by mouth 3 (three) times daily.      Not Delegated - Analgesics:  Muscle Relaxants Failed - 06/26/2020  9:10 AM      Failed - This refill cannot be delegated      Failed - Valid encounter within last 6 months    Recent Outpatient Visits           1 year ago Thyroiditis   [facility] [place], [place], [place]   1 year ago Chronic pain of both shoulders   [facility] [place], [place], [place]   2 years ago Vaginal itching   [facility] [place], [place], [place]   3 years ago Gastroesophageal reflux disease, esophagitis presence not specified   [facility] [place], [place], [place]   4 years ago Insomnia   [facility] Lorie Phenix, MD

## 2020-07-02 DIAGNOSIS — M4310 Spondylolisthesis, site unspecified: Secondary | ICD-10-CM | POA: Diagnosis not present

## 2020-07-02 DIAGNOSIS — M5136 Other intervertebral disc degeneration, lumbar region: Secondary | ICD-10-CM | POA: Diagnosis not present

## 2020-07-06 DIAGNOSIS — Z7952 Long term (current) use of systemic steroids: Secondary | ICD-10-CM | POA: Diagnosis not present

## 2020-07-06 DIAGNOSIS — M353 Polymyalgia rheumatica: Secondary | ICD-10-CM | POA: Diagnosis not present

## 2020-07-23 DIAGNOSIS — M353 Polymyalgia rheumatica: Secondary | ICD-10-CM | POA: Diagnosis not present

## 2020-07-23 DIAGNOSIS — Z7952 Long term (current) use of systemic steroids: Secondary | ICD-10-CM | POA: Diagnosis not present

## 2020-08-21 DIAGNOSIS — M353 Polymyalgia rheumatica: Secondary | ICD-10-CM | POA: Diagnosis not present

## 2020-08-21 DIAGNOSIS — Z7952 Long term (current) use of systemic steroids: Secondary | ICD-10-CM | POA: Diagnosis not present

## 2020-09-21 ENCOUNTER — Encounter: Payer: Self-pay | Admitting: Physician Assistant

## 2020-09-29 ENCOUNTER — Other Ambulatory Visit: Payer: Self-pay | Admitting: Physician Assistant

## 2020-09-29 DIAGNOSIS — K219 Gastro-esophageal reflux disease without esophagitis: Secondary | ICD-10-CM

## 2020-09-29 NOTE — Telephone Encounter (Signed)
Requested Prescriptions  Pending Prescriptions Disp Refills   omeprazole (PRILOSEC) 40 MG capsule [Pharmacy Med Name: OMEPRAZOLE DR CAPS 40MG ] 90 capsule 3    Sig: TAKE 1 CAPSULE DAILY     Gastroenterology: Proton Pump Inhibitors Failed - 09/29/2020 12:40 AM      Failed - Valid encounter within last 12 months    Recent Outpatient Visits          1 year ago Thyroiditis   [facility] [place], [place], [place]   2 years ago Chronic pain of both shoulders   [facility] [place], [place], [place]   2 years ago Vaginal itching   [facility] [place], [place], [place]   4 years ago Gastroesophageal reflux disease, esophagitis presence not specified   [facility] [place], [place], [place]   4 years ago Insomnia   [facility], MD             Attempted to call pt.  Left vm to call office and schedule appt. With PCP for follow up/ medication refills.

## 2020-09-30 ENCOUNTER — Other Ambulatory Visit: Payer: Self-pay

## 2020-09-30 ENCOUNTER — Ambulatory Visit (INDEPENDENT_AMBULATORY_CARE_PROVIDER_SITE_OTHER): Payer: BC Managed Care – PPO | Admitting: Physician Assistant

## 2020-09-30 ENCOUNTER — Encounter: Payer: Self-pay | Admitting: Physician Assistant

## 2020-09-30 VITALS — BP 148/94 | HR 66 | Temp 98.4°F | Resp 16 | Wt 232.2 lb

## 2020-09-30 DIAGNOSIS — Z6837 Body mass index (BMI) 37.0-37.9, adult: Secondary | ICD-10-CM | POA: Diagnosis not present

## 2020-09-30 DIAGNOSIS — M353 Polymyalgia rheumatica: Secondary | ICD-10-CM

## 2020-09-30 MED ORDER — PREDNISONE 1 MG PO TABS: 2.0000 mg | ORAL_TABLET | Freq: Every day | ORAL | 0 refills | Status: DC

## 2020-09-30 MED ORDER — PREDNISONE 5 MG PO TABS: 5.0000 mg | ORAL_TABLET | Freq: Every day | ORAL | 1 refills | Status: DC

## 2020-09-30 NOTE — Progress Notes (Signed)
Established patient visit   Patient: Alexis Newman   DOB: [birth year]   64 y.o. Female  MRN: [id number] Visit Date: 09/30/2020  Today's healthcare provider: Margaretann Loveless, PA-C   No chief complaint on file.  Subjective    HPI  Patient here to have a prescription refill. She was getting Prednisone for PRS. She sees Dr. Allena Katz, Rheumatology.  Patient received her influenza vaccine at the pharmacy. [facility].      Medications: Outpatient Medications Prior to Visit  Medication Sig   acetaminophen (TYLENOL) 500 MG tablet Take 1 tablet (500 mg total) by mouth every 6 (six) hours as needed.   baclofen (LIORESAL) 10 MG tablet Take 1 tablet (10 mg total) by mouth 3 (three) times daily.   cholecalciferol (VITAMIN D) 1000 units tablet Take 1 tablet (1,000 Units total) by mouth daily.   levothyroxine (SYNTHROID) 112 MCG tablet TAKE 1 TABLET BY MOUTH DAILY   omeprazole (PRILOSEC) 40 MG capsule TAKE 1 CAPSULE DAILY   Calcium Carb-Cholecalciferol (CALCIUM 600 + D PO) Take by mouth.   conjugated estrogens (PREMARIN) vaginal cream Place 1 Applicatorful vaginally daily.   predniSONE (DELTASONE) 1 MG tablet Take by mouth.   triamcinolone cream (KENALOG) 0.1 % Apply 1 application topically 2 (two) times daily.   No facility-administered medications prior to visit.    Review of Systems  Constitutional: Negative.   Respiratory: Negative.   Musculoskeletal: Positive for myalgias. Negative for arthralgias.  Neurological: Negative.     Last CBC Lab Results  Component Value Date   WBC 8.2 09/25/2018   HGB 12.9 09/25/2018   HCT 38.7 09/25/2018   MCV 86 09/25/2018   MCH 28.7 09/25/2018   RDW 12.8 09/25/2018   PLT 304 09/25/2018   Last metabolic panel Lab Results  Component Value Date   GLUCOSE 79 09/25/2018   NA 141 09/25/2018   K 4.4 09/25/2018   CL 100 09/25/2018   CO2 24 09/25/2018   BUN 15 09/25/2018   CREATININE 0.92 09/25/2018   GFRNONAA 66 09/25/2018    GFRAA 77 09/25/2018   CALCIUM 9.6 09/25/2018   PROT 6.9 09/25/2018   ALBUMIN 4.3 09/25/2018   LABGLOB 2.6 09/25/2018   AGRATIO 1.7 09/25/2018   BILITOT 0.3 09/25/2018   ALKPHOS 69 09/25/2018   AST 18 09/25/2018   ALT 20 09/25/2018      Objective    BP (!) 148/94 (BP Location: Left Arm, Patient Position: Sitting, Cuff Size: Large)    Pulse 66    Temp 98.4 F (36.9 C) (Oral)    Resp 16    Wt 232 lb 3.2 oz (105.3 kg)    BMI 37.48 kg/m  BP Readings from Last 3 Encounters:  09/30/20 (!) 148/94  09/25/18 (!) 152/96  11/16/17 (!) 142/80   Wt Readings from Last 3 Encounters:  09/30/20 232 lb 3.2 oz (105.3 kg)  09/25/18 211 lb 9.6 oz (96 kg)  11/16/17 223 lb (101.2 kg)      Physical Exam Vitals reviewed.  Constitutional:      General: She is not in acute distress.    Appearance: Normal appearance. She is well-developed. She is obese. She is not ill-appearing or diaphoretic.  Cardiovascular:     Rate and Rhythm: Normal rate and regular rhythm.     Pulses: Normal pulses.     Heart sounds: Normal heart sounds. No murmur heard.  No friction rub. No gallop.   Pulmonary:     Effort: Pulmonary effort is  normal. No respiratory distress.     Breath sounds: Normal breath sounds. No wheezing or rales.  Musculoskeletal:     Cervical back: Normal range of motion and neck supple.  Neurological:     Mental Status: She is alert.  Psychiatric:        Mood and Affect: Mood normal.        Behavior: Behavior normal.        Thought Content: Thought content normal.        Judgment: Judgment normal.      No results found for any visits on 09/30/20.  Assessment & Plan     1. Polymyalgia rheumatica syndrome (HCC) Stable. Diagnosis pulled for medication refill. Continue current medical treatment plan. Patient is trying to taper down. Has come down from 20mg  daily. Currently on 7mg  daily. Continue decreasing monthly as directed. If she has a flare she can increase acutely and then taper  back down as needed.  - predniSONE (DELTASONE) 5 MG tablet; Take 1 tablet (5 mg total) by mouth daily with breakfast.  Dispense: 90 tablet; Refill: 1 - predniSONE (DELTASONE) 1 MG tablet; Take 2 tablets (2 mg total) by mouth daily with breakfast.  Dispense: 180 tablet; Refill: 0  2. Class 2 severe obesity due to excess calories with serious comorbidity and body mass index (BMI) of 37.0 to 37.9 in adult [facility]) Counseled patient on healthy lifestyle modifications including dieting and exercise.    No follow-ups on file.      , PA-C, have reviewed all documentation for this visit. The documentation on 10/01/20 for the exam, diagnosis, procedures, and orders are all accurate and complete.   Delmer Islam  [facility] [phone] (phone) [phone] (fax)  [facility]

## 2020-10-01 ENCOUNTER — Encounter: Payer: Self-pay | Admitting: Physician Assistant

## 2020-10-06 ENCOUNTER — Telehealth: Payer: Self-pay

## 2020-10-06 NOTE — Telephone Encounter (Signed)
Copied from CRM [id number]. Topic: General - Other >> Oct 06, 2020 10:17 AM Gwenlyn Fudge wrote: Reason for CRM: Pt called and is requesting to speak with PCP regarding her disability placard paperwork. She states that she is needing a [facility] number, the bottom left box. Please advise.

## 2020-10-06 NOTE — Telephone Encounter (Signed)
Does pt need an OV for this?

## 2020-10-07 NOTE — Telephone Encounter (Signed)
Pt advised.   Thanks,   -Alexis Newman  

## 2020-10-07 NOTE — Telephone Encounter (Signed)
I am sorry I forgot that.  She can write it in. [phone]

## 2020-11-16 DIAGNOSIS — M353 Polymyalgia rheumatica: Secondary | ICD-10-CM | POA: Diagnosis not present

## 2020-11-16 DIAGNOSIS — Z7952 Long term (current) use of systemic steroids: Secondary | ICD-10-CM | POA: Diagnosis not present

## 2020-11-27 ENCOUNTER — Other Ambulatory Visit: Payer: BLUE CROSS/BLUE SHIELD

## 2020-11-27 DIAGNOSIS — Z1152 Encounter for screening for COVID-19: Secondary | ICD-10-CM | POA: Diagnosis not present

## 2020-11-27 DIAGNOSIS — Z03818 Encounter for observation for suspected exposure to other biological agents ruled out: Secondary | ICD-10-CM | POA: Diagnosis not present

## 2021-01-04 ENCOUNTER — Other Ambulatory Visit: Payer: Self-pay | Admitting: Physician Assistant

## 2021-01-04 DIAGNOSIS — K219 Gastro-esophageal reflux disease without esophagitis: Secondary | ICD-10-CM

## 2021-01-20 ENCOUNTER — Other Ambulatory Visit: Payer: Self-pay | Admitting: Physician Assistant

## 2021-01-20 DIAGNOSIS — M62838 Other muscle spasm: Secondary | ICD-10-CM

## 2021-01-20 MED ORDER — BACLOFEN 10 MG PO TABS: 10.0000 mg | ORAL_TABLET | Freq: Three times a day (TID) | ORAL | 5 refills | Status: DC

## 2021-01-20 NOTE — Telephone Encounter (Signed)
[facility] faxed refill request for the following medications:  baclofen (LIORESAL) 10 MG tablet  Last Rx: 06/26/2020 LOV: 09/30/2020 Please advise. Thanks TNP

## 2021-01-29 ENCOUNTER — Encounter: Payer: Self-pay | Admitting: Physician Assistant

## 2021-01-29 DIAGNOSIS — M62838 Other muscle spasm: Secondary | ICD-10-CM

## 2021-02-09 MED ORDER — BACLOFEN 10 MG PO TABS: 10.0000 mg | ORAL_TABLET | Freq: Three times a day (TID) | ORAL | 5 refills | Status: DC

## 2021-02-09 NOTE — Addendum Note (Signed)
Addended by: Kavin Leech E on: 02/09/2021 10:04 AM   Modules accepted: Orders

## 2021-02-23 ENCOUNTER — Ambulatory Visit: Payer: Self-pay

## 2021-02-23 NOTE — Telephone Encounter (Signed)
SOB with activity and dry cough. Took  Rapid covid test 2 dasy ago and both were negative.  chills- no fever-No wheezing or chest pain.  Care advice given and pt verbalized understanding. Pt has virtual visit 02/24/21.  Reason for Disposition . [1] MILD difficulty breathing (e.g., minimal/no SOB at rest, SOB with walking, pulse <100) AND [2] NEW-onset or WORSE than normal    SOB with activity began 2 days ago.  Answer Assessment - Initial Assessment Questions 1. RESPIRATORY STATUS: "Describe your breathing?" (e.g., wheezing, shortness of breath, unable to speak, severe coughing)      SOB with activity 2. ONSET: "When did this breathing problem begin?"      2 days ago 3. PATTERN "Does the difficult breathing come and go, or has it been constant since it started?"      Comes and goes 4. SEVERITY: "How bad is your breathing?" (e.g., mild, moderate, severe)    - MILD: No SOB at rest, mild SOB with walking, speaks normally in sentences, can lay down, no retractions, pulse < 100.    - MODERATE: SOB at rest, SOB with minimal exertion and prefers to sit, cannot lie down flat, speaks in phrases, mild retractions, audible wheezing, pulse 100-120.    - SEVERE: Very SOB at rest, speaks in single words, struggling to breathe, sitting hunched forward, retractions, pulse > 120      mild 5. RECURRENT SYMPTOM: "Have you had difficulty breathing before?" If Yes, ask: "When was the last time?" and "What happened that time?"      Yes- chest cold 6. CARDIAC HISTORY: "Do you have any history of heart disease?" (e.g., heart attack, angina, bypass surgery, angioplasty)      no 7. LUNG HISTORY: "Do you have any history of lung disease?"  (e.g., pulmonary embolus, asthma, emphysema)     no 8. CAUSE: "What do you think is causing the breathing problem?"      Resp. virus 9. OTHER SYMPTOMS: "Do you have any other symptoms? (e.g., dizziness, runny nose, cough, chest pain, fever)     Chills, dry cough 11. TRAVEL: "Have  you traveled out of the country in the last month?" (e.g., travel history, exposures)       no  Protocols used: BREATHING DIFFICULTY-A-AH

## 2021-02-24 ENCOUNTER — Ambulatory Visit: Payer: BC Managed Care – PPO | Admitting: Physician Assistant

## 2021-02-24 ENCOUNTER — Telehealth (INDEPENDENT_AMBULATORY_CARE_PROVIDER_SITE_OTHER): Payer: BC Managed Care – PPO | Admitting: Physician Assistant

## 2021-02-24 DIAGNOSIS — M62838 Other muscle spasm: Secondary | ICD-10-CM

## 2021-02-24 DIAGNOSIS — M545 Low back pain, unspecified: Secondary | ICD-10-CM

## 2021-02-24 DIAGNOSIS — G8929 Other chronic pain: Secondary | ICD-10-CM | POA: Diagnosis not present

## 2021-02-24 DIAGNOSIS — R059 Cough, unspecified: Secondary | ICD-10-CM | POA: Diagnosis not present

## 2021-02-24 MED ORDER — HYDROCOD POLST-CPM POLST ER 10-8 MG/5ML PO SUER
5.0000 mL | Freq: Two times a day (BID) | ORAL | 0 refills | Status: DC | PRN
Start: 2021-02-24 — End: 2021-04-15

## 2021-02-24 MED ORDER — BACLOFEN 10 MG PO TABS: 10.0000 mg | ORAL_TABLET | Freq: Three times a day (TID) | ORAL | 5 refills | Status: DC

## 2021-02-24 NOTE — Progress Notes (Signed)
MyChart Video Visit    Virtual Visit via Video Note   This visit type was conducted due to national recommendations for restrictions regarding the COVID-19 Pandemic (e.g. social distancing) in an effort to limit this patient's exposure and mitigate transmission in our community. This patient is at least at moderate risk for complications without adequate follow up. This format is felt to be most appropriate for this patient at this time. Physical exam was limited by quality of the video and audio technology used for the visit.   Patient location: Home Provider location: [facility]  I discussed the limitations of evaluation and management by telemedicine and the availability of in person appointments. The patient expressed understanding and agreed to proceed.  Patient: Alexis Newman   DOB: 02-16-1956   65 y.o. Female  MRN: [id number] Visit Date: 02/24/2021  Today's healthcare provider: Margaretann Loveless, PA-C   Chief Complaint  Patient presents with  . Back Pain  . URI   Subjective    Back Pain The problem has been waxing and waning since onset. The symptoms are aggravated by sitting (Stress). Pertinent negatives include no bladder incontinence, bowel incontinence, headaches, leg pain, numbness, paresthesias, tingling or weakness.  URI  Associated symptoms include coughing. Pertinent negatives include no headaches, neck pain or wheezing.     Patient Active Problem List   Diagnosis Date Noted  . Pseudogout 06/25/2020  . Pain due to onychomycosis of toenail of right foot 04/16/2020  . 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 Polymyalgia rheumatica syndrome (HCC) 10/17/2018  . Long term current use of systemic steroids 10/17/2018  . Insomnia 04/06/2016  . Hyperglycemia 04/06/2016  . Other fatigue 07/06/2015  . Palpitations 07/06/2015  . Itch of skin 05/15/2015  . Endometriosis 05/15/2015  . Esophageal reflux 05/15/2015  . Gout 05/15/2015  . Difficulty hearing 05/15/2015  .  Osteoarthrosis 05/15/2015  . Polycystic ovaries 05/15/2015  . Hypercholesterolemia without hypertriglyceridemia 05/15/2015  . Sleep apnea 05/15/2015  . Thyroiditis 05/15/2015  . Vitamin D deficiency 05/15/2015  . Pruritic dermatitis 05/15/2015  . Hearing loss 05/15/2015  . Arthritis 04/08/2015  . Plantar fasciitis 04/08/2015  . Varicella without complication 04/08/2015  . 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 Mononucleosis 06/28/2009   Past Medical History:  Diagnosis Date  . Allergy   . Arthritis   . Chickenpox   . GERD (gastroesophageal reflux disease)   . Hypothyroidism   . Mononucleosis   . Pseudogout   . Sleep apnea   . Thyroid disease    Social History   Tobacco Use  . Smoking status: Never Smoker  . Smokeless tobacco: Never Used  Substance Use Topics  . Alcohol use: Yes    Alcohol/week: 2.0 standard drinks    Types: 2 Cans of beer per week    Comment: Occasionally  . Drug use: No   No Known Allergies  Medications: Outpatient Medications Prior to Visit  Medication Sig  . acetaminophen (TYLENOL) 500 MG tablet Take 1 tablet (500 mg total) by mouth every 6 (six) hours as needed.  . baclofen (LIORESAL) 10 MG tablet Take 1 tablet (10 mg total) by mouth 3 (three) times daily.  . cholecalciferol (VITAMIN D) 1000 units tablet Take 1 tablet (1,000 Units total) by mouth daily.  Marland Kitchen levothyroxine (SYNTHROID) 112 MCG tablet TAKE 1 TABLET BY MOUTH DAILY  . omeprazole (PRILOSEC) 40 MG capsule TAKE 1 CAPSULE DAILY (NEEDS TO SCHEDULE FOLLOW UP APPOINTMENT FOR FURTHER  REFILLS)  . predniSONE (DELTASONE) 1 MG tablet Take 2 tablets (2 mg total) by mouth daily with breakfast.  . predniSONE (DELTASONE) 5 MG tablet Take 1 tablet (5 mg total) by mouth daily with breakfast.  . Calcium Carb-Cholecalciferol (CALCIUM 600 + D PO) Take by mouth.   No facility-administered medications prior to visit.    Review of Systems   Constitutional: Negative.   Respiratory: Positive for cough and shortness of breath. Negative for apnea, choking, chest tightness, wheezing and stridor.   Gastrointestinal: Negative for bowel incontinence.  Genitourinary: Negative for bladder incontinence.  Musculoskeletal: Positive for back pain. Negative for arthralgias, gait problem, joint swelling, myalgias, neck pain and neck stiffness.  Neurological: Negative for dizziness, tingling, weakness, light-headedness, numbness, headaches and paresthesias.      Objective    There were no vitals taken for this visit.   Physical Exam     Assessment & Plan     1. Muscle spasm Stable. Diagnosis pulled for medication refill. Continue current medical treatment plan. Referral placed to Physical medicine as below for further evaluation.  - baclofen (LIORESAL) 10 MG tablet; Take 1 tablet (10 mg total) by mouth 3 (three) times daily.  Dispense: 90 tablet; Refill: 5 - Ambulatory referral to Physical Medicine Rehab  2. Cough Worsening symptoms that has not responded to OTC medications. Will give Tussionex cough syrup as below for nighttime cough. Drowsiness precautions given to patient. Stay well hydrated. Use delsym, robitussin OR mucinex for daytime cough. - chlorpheniramine-HYDROcodone (TUSSIONEX PENNKINETIC ER) 10-8 MG/5ML SUER; Take 5 mLs by mouth every 12 (twelve) hours as needed for cough.  Dispense: 140 mL; Refill: 0  3. Chronic midline low back pain without sciatica See above medical treatment plan for #1.  - Ambulatory referral to Physical Medicine Rehab   No follow-ups on file.     I discussed the assessment and treatment plan with the patient. The patient was provided an opportunity to ask questions and all were answered. The patient agreed with the plan and demonstrated an understanding of the instructions.   The patient was advised to call back or seek an in-person evaluation if the symptoms worsen or if the condition fails  to improve as anticipated.  I provided 18 minutes of face-to-face time during this encounter via MyChart Video enabled encounter.  Delmer Islam, PA-C, have reviewed all documentation for this visit. The documentation on 03/07/21 for the exam, diagnosis, procedures, and orders are all accurate and complete.  [facility] [phone] (phone) [phone] (fax)  [facility]

## 2021-03-07 ENCOUNTER — Encounter: Payer: Self-pay | Admitting: Physician Assistant

## 2021-03-08 ENCOUNTER — Ambulatory Visit: Payer: BC Managed Care – PPO | Admitting: Family Medicine

## 2021-03-17 DIAGNOSIS — M5416 Radiculopathy, lumbar region: Secondary | ICD-10-CM | POA: Diagnosis not present

## 2021-03-17 DIAGNOSIS — M5136 Other intervertebral disc degeneration, lumbar region: Secondary | ICD-10-CM | POA: Diagnosis not present

## 2021-03-21 ENCOUNTER — Other Ambulatory Visit: Payer: Self-pay | Admitting: Physician Assistant

## 2021-03-21 DIAGNOSIS — E069 Thyroiditis, unspecified: Secondary | ICD-10-CM

## 2021-03-22 NOTE — Telephone Encounter (Signed)
   Notes to clinic:  this script has expired  Review for continued  use and refill    Requested Prescriptions  Pending Prescriptions Disp Refills   levothyroxine (SYNTHROID) 112 MCG tablet [Pharmacy Med Name: L-THYROXINE TABS 112MCG] 90 tablet 3    Sig: TAKE 1 TABLET BY MOUTH DAILY      Endocrinology:  Hypothyroid Agents Failed - 03/21/2021 10:26 AM      Failed - TSH needs to be rechecked within 3 months after an abnormal result. Refill until TSH is due.      Failed - TSH in normal range and within 360 days    TSH  Date Value Ref Range Status  09/25/2018 0.597 0.450 - 4.500 uIU/mL Final          Passed - Valid encounter within last 12 months    Recent Outpatient Visits           3 weeks ago Cough   [facility] [place], [place], PA-C   5 months ago Polymyalgia rheumatica syndrome [facility])   [facility] [place], [place], [place]   1 year ago Thyroiditis   [facility] [place], [place], [place]   2 years ago Chronic pain of both shoulders   [facility] [place], [place], [place]   3 years ago Vaginal itching   [facility] [place], [place], [place]       Future Appointments             In 3 weeks Bacigalupo, Marzella Schlein, MD [facility], [facility]

## 2021-03-25 ENCOUNTER — Ambulatory Visit: Payer: BC Managed Care – PPO | Admitting: Family Medicine

## 2021-04-02 ENCOUNTER — Other Ambulatory Visit: Payer: Self-pay | Admitting: Physician Assistant

## 2021-04-02 DIAGNOSIS — M353 Polymyalgia rheumatica: Secondary | ICD-10-CM

## 2021-04-02 NOTE — Telephone Encounter (Signed)
Requested medication (s) are due for refill today:  yes  Requested medication (s) are on the active medication list: yes  Last refill:  12/31/2020  Future visit scheduled: yes  Notes to clinic:  this refill cannot be delegated    Requested Prescriptions  Pending Prescriptions Disp Refills   predniSONE (DELTASONE) 5 MG tablet [Pharmacy Med Name: PREDNISONE 5 MG TABLET] 90 tablet 1    Sig: TAKE 1 TABLET BY MOUTH EVERY DAY WITH BREAKFAST      Not Delegated - Endocrinology:  Oral Corticosteroids Failed - 04/02/2021  8:39 AM      Failed - This refill cannot be delegated      Failed - Last BP in normal range    BP Readings from Last 1 Encounters:  09/30/20 (!) 148/94          Passed - Valid encounter within last 6 months    Recent Outpatient Visits           1 month ago Cough   [facility] [place], [place], PA-C   6 months ago Polymyalgia rheumatica syndrome [place])   [facility] [place], Al[place]andra Bevel[place], [place]   1 year ago Thyroiditis   [facility] [place], [place], [place]   2 years ago Chronic pain of both shoulders   [facility] [place], [place], [place]   3 years ago Vaginal itching   [facility] [place], [place]andra [place], [place]       Future Appointments             In 1 week Bacigalupo, Marzella Schlein, MD [facility], [facility]

## 2021-04-02 NOTE — Telephone Encounter (Signed)
Rheumatology is tapering and weaning her off of this.

## 2021-04-05 DIAGNOSIS — M25552 Pain in left hip: Secondary | ICD-10-CM | POA: Diagnosis not present

## 2021-04-05 DIAGNOSIS — M545 Low back pain, unspecified: Secondary | ICD-10-CM | POA: Diagnosis not present

## 2021-04-05 DIAGNOSIS — M25551 Pain in right hip: Secondary | ICD-10-CM | POA: Diagnosis not present

## 2021-04-05 DIAGNOSIS — M6281 Muscle weakness (generalized): Secondary | ICD-10-CM | POA: Diagnosis not present

## 2021-04-07 DIAGNOSIS — M6281 Muscle weakness (generalized): Secondary | ICD-10-CM | POA: Diagnosis not present

## 2021-04-07 DIAGNOSIS — M545 Low back pain, unspecified: Secondary | ICD-10-CM | POA: Diagnosis not present

## 2021-04-07 DIAGNOSIS — M25551 Pain in right hip: Secondary | ICD-10-CM | POA: Diagnosis not present

## 2021-04-07 DIAGNOSIS — M25552 Pain in left hip: Secondary | ICD-10-CM | POA: Diagnosis not present

## 2021-04-12 DIAGNOSIS — M6281 Muscle weakness (generalized): Secondary | ICD-10-CM | POA: Diagnosis not present

## 2021-04-12 DIAGNOSIS — M545 Low back pain, unspecified: Secondary | ICD-10-CM | POA: Diagnosis not present

## 2021-04-12 DIAGNOSIS — M25552 Pain in left hip: Secondary | ICD-10-CM | POA: Diagnosis not present

## 2021-04-12 DIAGNOSIS — M25551 Pain in right hip: Secondary | ICD-10-CM | POA: Diagnosis not present

## 2021-04-14 DIAGNOSIS — M6281 Muscle weakness (generalized): Secondary | ICD-10-CM | POA: Diagnosis not present

## 2021-04-14 DIAGNOSIS — M545 Low back pain, unspecified: Secondary | ICD-10-CM | POA: Diagnosis not present

## 2021-04-14 DIAGNOSIS — M25551 Pain in right hip: Secondary | ICD-10-CM | POA: Diagnosis not present

## 2021-04-14 DIAGNOSIS — M25552 Pain in left hip: Secondary | ICD-10-CM | POA: Diagnosis not present

## 2021-04-15 ENCOUNTER — Ambulatory Visit: Payer: BC Managed Care – PPO | Admitting: Family Medicine

## 2021-04-15 ENCOUNTER — Other Ambulatory Visit: Payer: Self-pay

## 2021-04-15 ENCOUNTER — Encounter: Payer: Self-pay | Admitting: Family Medicine

## 2021-04-15 VITALS — BP 151/89 | HR 76 | Temp 98.5°F | Resp 16 | Ht 66.0 in | Wt 228.3 lb

## 2021-04-15 DIAGNOSIS — E7800 Pure hypercholesterolemia, unspecified: Secondary | ICD-10-CM | POA: Diagnosis not present

## 2021-04-15 DIAGNOSIS — I10 Essential (primary) hypertension: Secondary | ICD-10-CM | POA: Insufficient documentation

## 2021-04-15 DIAGNOSIS — R030 Elevated blood-pressure reading, without diagnosis of hypertension: Secondary | ICD-10-CM

## 2021-04-15 DIAGNOSIS — E669 Obesity, unspecified: Secondary | ICD-10-CM

## 2021-04-15 DIAGNOSIS — M353 Polymyalgia rheumatica: Secondary | ICD-10-CM

## 2021-04-15 DIAGNOSIS — E559 Vitamin D deficiency, unspecified: Secondary | ICD-10-CM

## 2021-04-15 DIAGNOSIS — E038 Other specified hypothyroidism: Secondary | ICD-10-CM

## 2021-04-15 DIAGNOSIS — E063 Autoimmune thyroiditis: Secondary | ICD-10-CM

## 2021-04-15 DIAGNOSIS — Z6836 Body mass index (BMI) 36.0-36.9, adult: Secondary | ICD-10-CM

## 2021-04-15 NOTE — Assessment & Plan Note (Signed)
Recheck level 

## 2021-04-15 NOTE — Assessment & Plan Note (Signed)
Previously well controlled Continue Synthroid at current dose  Is feeling more fatigued Recheck TSH and adjust Synthroid as indicated

## 2021-04-15 NOTE — Progress Notes (Signed)
Established patient visit   Patient: Alexis Newman   DOB: Dec 20, 1955   65 y.o. Female  MRN: [id number] Visit Date: 04/15/2021  Today's healthcare provider: Lavon Paganini, MD   Chief Complaint  Patient presents with  . Follow-up   Subjective    HPI  Polymyalgia rheumatica She has consulted a rheumatologist. She was taking Prednisone but she didn't enjoy the affects. She has been off the medication for 2 months. Her lower back has been bothering her for about 2 years. She started physical therapy 2 weeks ago and she has found relief in her pain. She says that her daily activities are beginning to be affect. Her next appointment with the physiatrist is in 3 weeks. She denies having a follow up for rheumatology.   Thyroiditis  She reports feeling fatigue and believes it can be related to her thyroid. She is going to bed earlier but still getting up around 5 or 6 in the morning. She reports this is unfamiliar for her.   Hypertension She reports her last blood pressure check at the physiatrist was in the 140s.   Work Life She is currently working at [employer] and she getting ready for phase retirement.   Follow up for thyroiditis  The patient was last seen for this 1 years ago. Changes made at last visit include no changes continue levothyroxine 112 mcg daily.  She reports excellent compliance with treatment. She feels that condition is Unchanged.Patient reports that she feels fatigued all the time. She is not having side effects.   -----------------------------------------------------------------------------------------  Follow up for polymyalgia rheumatica  The patient was last seen for this 6 months ago. Changes made at last visit include patient trying to taper down on prednisone.  She reports excellent compliance with treatment. She feels that condition is Unchanged. Patient would like to discuss today referral for Rheumatology, she states " I  hurt all over." She is not having side effects.   -----------------------------------------------------------------------------------------  Patient Active Problem List   Diagnosis Date Noted  . Elevated BP without diagnosis of hypertension 04/15/2021  . Class 2 obesity without serious comorbidity with body mass index (BMI) of 36.0 to 36.9 in adult 04/15/2021  . Pseudogout 06/25/2020  . Pain due to onychomycosis of toenail of right foot 04/16/2020  . 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 Polymyalgia rheumatica syndrome ([place]) 10/17/2018  . Insomnia 04/06/2016  . Other fatigue 07/06/2015  . Palpitations 07/06/2015  . Endometriosis 05/15/2015  . Esophageal reflux 05/15/2015  . Gout 05/15/2015  . Difficulty hearing 05/15/2015  . Osteoarthrosis 05/15/2015  . Polycystic ovaries 05/15/2015  . Hypercholesterolemia without hypertriglyceridemia 05/15/2015  . Sleep apnea 05/15/2015  . Hypothyroidism 05/15/2015  . Vitamin D deficiency 05/15/2015  . Pruritic dermatitis 05/15/2015  . Hearing loss 05/15/2015  . Arthritis 04/08/2015  . Plantar fasciitis 04/08/2015  . 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 Smoking status: Never Smoker  . Smokeless tobacco: Never Used  Substance Use Topics  . Alcohol use: Yes    Alcohol/week: 2.0 standard drinks    Types: 2 Cans of beer per week    Comment: Occasionally  . Drug use: No   No Known Allergies     Medications: Outpatient Medications Prior to Visit  Medication Sig  . acetaminophen (TYLENOL) 500 MG tablet Take 1 tablet (500 mg total) by mouth every 6 (six) hours as needed.  . baclofen (  LIORESAL) 10 MG tablet Take 1 tablet (10 mg total) by mouth 3 (three) times daily.  . cholecalciferol (VITAMIN D) 1000 units tablet Take 1 tablet (1,000 Units total) by mouth daily.  Marland Kitchen levothyroxine (SYNTHROID) 112 MCG tablet TAKE 1 TABLET BY MOUTH DAILY  . omeprazole  (PRILOSEC) 40 MG capsule TAKE 1 CAPSULE DAILY (NEEDS TO SCHEDULE FOLLOW UP APPOINTMENT FOR FURTHER REFILLS)  . Calcium Carb-Cholecalciferol (CALCIUM 600 + D PO) Take by mouth.  . [DISCONTINUED] chlorpheniramine-HYDROcodone (TUSSIONEX PENNKINETIC ER) 10-8 MG/5ML SUER Take 5 mLs by mouth every 12 (twelve) hours as needed for cough.  . [DISCONTINUED] predniSONE (DELTASONE) 1 MG tablet Take 2 tablets (2 mg total) by mouth daily with breakfast.  . [DISCONTINUED] predniSONE (DELTASONE) 5 MG tablet TAKE 1 TABLET BY MOUTH EVERY DAY WITH BREAKFAST   No facility-administered medications prior to visit.    Review of Systems  Constitutional: Positive for fatigue. Negative for chills and fever.  HENT: Negative for ear pain, nosebleeds, rhinorrhea, sinus pressure, sinus pain and sore throat.   Eyes: Negative for pain.  Respiratory: Negative for cough, chest tightness, shortness of breath and wheezing.   Cardiovascular: Negative for chest pain, palpitations and leg swelling.  Gastrointestinal: Negative for abdominal pain, blood in stool, constipation, diarrhea, nausea and vomiting.  Genitourinary: Negative for dysuria, flank pain, frequency, hematuria, pelvic pain and urgency.  Musculoskeletal: Positive for back pain and myalgias. Negative for neck pain and neck stiffness.  Neurological: Negative for dizziness, seizures, syncope, weakness, light-headedness, numbness and headaches.    Last metabolic panel Lab Results  Component Value Date   GLUCOSE 79 09/25/2018   NA 141 09/25/2018   K 4.4 09/25/2018   CL 100 09/25/2018   CO2 24 09/25/2018   BUN 15 09/25/2018   CREATININE 0.92 09/25/2018   GFRNONAA 66 09/25/2018   GFRAA 77 09/25/2018   CALCIUM 9.6 09/25/2018   PROT 6.9 09/25/2018   ALBUMIN 4.3 09/25/2018   LABGLOB 2.6 09/25/2018   AGRATIO 1.7 09/25/2018   BILITOT 0.3 09/25/2018   ALKPHOS 69 09/25/2018   AST 18 09/25/2018   ALT 20 09/25/2018   Last thyroid functions Lab Results  Component  Value Date   TSH 0.597 09/25/2018        Objective    BP (!) 151/89   Pulse 76   Temp 98.5 F (36.9 C) (Oral)   Resp 16   Ht _0  (1.676 m)   Wt 228 lb 4.8 oz (103.6 kg)   SpO2 100%   BMI 36.85 kg/m  BP Readings from Last 3 Encounters:  04/15/21 (!) 151/89  09/30/20 (!) 148/94  09/25/18 (!) 152/96   Wt Readings from Last 3 Encounters:  04/15/21 228 lb 4.8 oz (103.6 kg)  09/30/20 232 lb 3.2 oz (105.3 kg)  09/25/18 211 lb 9.6 oz (96 kg)      Physical Exam Vitals reviewed.  Constitutional:      General: She is not in acute distress.    Appearance: Normal appearance. She is well-developed. She is not diaphoretic.  HENT:     Head: Normocephalic and atraumatic.  Eyes:     General: No scleral icterus.    Conjunctiva/sclera: Conjunctivae normal.  Neck:     Thyroid: No thyromegaly.  Cardiovascular:     Rate and Rhythm: Normal rate and regular rhythm.     Pulses: Normal pulses.     Heart sounds: Normal heart sounds. No murmur heard.   Pulmonary:     Effort: Pulmonary effort is  normal. No respiratory distress.     Breath sounds: Normal breath sounds. No wheezing, rhonchi or rales.  Musculoskeletal:     Cervical back: Neck supple.     Right lower leg: No edema.     Left lower leg: No edema.  Lymphadenopathy:     Cervical: No cervical adenopathy.  Skin:    General: Skin is warm and dry.     Findings: No rash.  Neurological:     Mental Status: She is alert and oriented to person, place, and time. Mental status is at baseline.  Psychiatric:        Mood and Affect: Mood normal.        Behavior: Behavior normal.       No results found for any visits on 04/15/21.  Assessment & Plan     Problem List Items Addressed This Visit      Endocrine   Hypothyroidism - Primary    Previously well controlled Continue Synthroid at current dose  Is feeling more fatigued Recheck TSH and adjust Synthroid as indicated       Relevant Orders   TSH     Other    Hypercholesterolemia without hypertriglyceridemia    Reviewed last lipid panel Not currently on a statin Recheck FLP and CMP Discussed diet and exercise       Relevant Orders   Comprehensive metabolic panel   Lipid panel   Vitamin D deficiency    Recheck level      Relevant Orders   VITAMIN D 25 Hydroxy (Vit-D Deficiency, Fractures)   Polymyalgia rheumatica syndrome ([place])    Followed by rheumatology-encouraged her to call and schedule her follow-up appointment Off of prednisone x2 months and having some recurrence of joint pain Working with physiatry for improving function Recheck ESR and CRP      Relevant Orders   Sed Rate (ESR)   C-reactive protein   Elevated BP without diagnosis of hypertension    New finding Asymptomatic Encourage home blood pressure monitoring Goal BP less than 140/90 Encourage DASH diet Follow-up in 3 months and if still elevated, consider antihypertensive      Class 2 obesity without serious comorbidity with body mass index (BMI) of 36.0 to 36.9 in adult    Discussed importance of healthy weight management Discussed diet and exercise           Return in about 3 months (around 07/16/2021) for BP f/u.       I,Essence Turner,acting as a Education administrator for Lavon Paganini, MD.,have documented all relevant documentation on the behalf of Lavon Paganini, MD,as directed by  Lavon Paganini, MD while in the presence of Lavon Paganini, MD.    I, Lavon Paganini, MD, have reviewed all documentation for this visit. The documentation on 04/15/21 for the exam, diagnosis, procedures, and orders are all accurate and complete.   Mavric Cortright, Dionne Bucy, MD, MPH [facility]

## 2021-04-15 NOTE — Patient Instructions (Signed)

## 2021-04-15 NOTE — Assessment & Plan Note (Signed)
Discussed importance of healthy weight management Discussed diet and exercise  

## 2021-04-15 NOTE — Assessment & Plan Note (Signed)
Followed by rheumatology-encouraged her to call and schedule her follow-up appointment Off of prednisone x2 months and having some recurrence of joint pain Working with physiatry for improving function Recheck ESR and CRP

## 2021-04-15 NOTE — Assessment & Plan Note (Signed)
Reviewed last lipid panel Not currently on a statin Recheck FLP and CMP Discussed diet and exercise  

## 2021-04-15 NOTE — Assessment & Plan Note (Signed)
New finding Asymptomatic Encourage home blood pressure monitoring Goal BP less than 140/90 Encourage DASH diet Follow-up in 3 months and if still elevated, consider antihypertensive

## 2021-04-16 ENCOUNTER — Telehealth: Payer: Self-pay

## 2021-04-16 DIAGNOSIS — E038 Other specified hypothyroidism: Secondary | ICD-10-CM

## 2021-04-16 DIAGNOSIS — E7800 Pure hypercholesterolemia, unspecified: Secondary | ICD-10-CM

## 2021-04-16 DIAGNOSIS — E063 Autoimmune thyroiditis: Secondary | ICD-10-CM

## 2021-04-16 LAB — LIPID PANEL
Chol/HDL Ratio: 4.4 ratio (ref 0.0–4.4)
Cholesterol, Total: 217 mg/dL — ABNORMAL HIGH (ref 100–199)
HDL: 49 mg/dL (ref >39)
LDL Chol Calc (NIH): 132 mg/dL — ABNORMAL HIGH (ref 0–99)
Triglycerides: 205 mg/dL — ABNORMAL HIGH (ref 0–149)
VLDL Cholesterol Cal: 36 mg/dL (ref 5–40)

## 2021-04-16 LAB — COMPREHENSIVE METABOLIC PANEL
ALT: 31 IU/L (ref 0–32)
AST: 24 IU/L (ref 0–40)
Albumin/Globulin Ratio: 1.7 (ref 1.2–2.2)
Albumin: 4.7 g/dL (ref 3.8–4.8)
Alkaline Phosphatase: 75 IU/L (ref 44–121)
BUN/Creatinine Ratio: 18 (ref 12–28)
BUN: 16 mg/dL (ref 8–27)
Bilirubin Total: 0.4 mg/dL (ref 0.0–1.2)
CO2: 21 mmol/L (ref 20–29)
Calcium: 10.2 mg/dL (ref 8.7–10.3)
Chloride: 101 mmol/L (ref 96–106)
Creatinine, Ser: 0.91 mg/dL (ref 0.57–1.00)
Globulin, Total: 2.7 g/dL (ref 1.5–4.5)
Glucose: 108 mg/dL — ABNORMAL HIGH (ref 65–99)
Potassium: 4.6 mmol/L (ref 3.5–5.2)
Sodium: 143 mmol/L (ref 134–144)
Total Protein: 7.4 g/dL (ref 6.0–8.5)
eGFR: 70 mL/min/{1.73_m2} (ref >59)

## 2021-04-16 LAB — VITAMIN D 25 HYDROXY (VIT D DEFICIENCY, FRACTURES): Vit D, 25-Hydroxy: 27.4 ng/mL — ABNORMAL LOW (ref 30.0–100.0)

## 2021-04-16 LAB — TSH: TSH: 0.264 u[IU]/mL — ABNORMAL LOW (ref 0.450–4.500)

## 2021-04-16 LAB — C-REACTIVE PROTEIN: CRP: 20 mg/L — ABNORMAL HIGH (ref 0–10)

## 2021-04-16 LAB — SEDIMENTATION RATE: Sed Rate: 24 mm/hr (ref 0–40)

## 2021-04-16 MED ORDER — ROSUVASTATIN CALCIUM 5 MG PO TABS
5.0000 mg | ORAL_TABLET | Freq: Every day | ORAL | 3 refills | Status: DC
Start: 2021-04-16 — End: 2021-07-16

## 2021-04-16 MED ORDER — LEVOTHYROXINE SODIUM 100 MCG PO TABS: 100.0000 ug | ORAL_TABLET | Freq: Every day | ORAL | 1 refills | Status: DC

## 2021-04-16 NOTE — Telephone Encounter (Signed)
-----  Message from Virginia Crews, MD sent at 04/16/2021 11:58 AM EDT ----- Normal kidney function, liver function, electrolytes.  Cholesterol is elevated. The 10-year ASCVD (heart disease and stroke) risk score Alexis Newman DC Jr., et al., 2013) is: 8.3%, which is high.  I would recommend a statin to lower this risk.  If patient is agreeable, we can start Crestor 5 mg daily #90 with 3 refills.  I also recommend diet low in saturated fat and regular exercise - 30 min at least 5 times per week.  TSH, thyroid-stimulating hormone, is low.  This suggests that her Synthroid dose may be slightly too high.  If she has been taking the Synthroid 112 mcg daily with good compliance, I recommend decreasing this to 100 mcg daily.  If she agrees, okay to send in new refill for the 100 mcg dose #90 with 1 refill.  Recommend rechecking TSH and 2 to 3 months.  ESR and CRP are improving.  Vitamin D is slightly low.  Recommend OTC vitamin D3 1000 to 2000 units daily.

## 2021-04-16 NOTE — Telephone Encounter (Signed)
Patient has been advised. KW 

## 2021-04-21 ENCOUNTER — Telehealth: Payer: Self-pay

## 2021-04-21 ENCOUNTER — Other Ambulatory Visit: Payer: Self-pay | Admitting: Physician Assistant

## 2021-04-21 DIAGNOSIS — M545 Low back pain, unspecified: Secondary | ICD-10-CM | POA: Diagnosis not present

## 2021-04-21 DIAGNOSIS — M25551 Pain in right hip: Secondary | ICD-10-CM | POA: Diagnosis not present

## 2021-04-21 DIAGNOSIS — E069 Thyroiditis, unspecified: Secondary | ICD-10-CM

## 2021-04-21 DIAGNOSIS — M6281 Muscle weakness (generalized): Secondary | ICD-10-CM | POA: Diagnosis not present

## 2021-04-21 DIAGNOSIS — K219 Gastro-esophageal reflux disease without esophagitis: Secondary | ICD-10-CM

## 2021-04-21 DIAGNOSIS — M25552 Pain in left hip: Secondary | ICD-10-CM | POA: Diagnosis not present

## 2021-04-21 NOTE — Telephone Encounter (Signed)
Copied from CRM [id number]. Topic: General - Other >> Apr 21, 2021 12:32 PM Lizzie, An A wrote: Reason for CRM: Patient called in to inquire if there are any Pfizer booster vaccines available at this location asking for a call back please at Ph# [phone]

## 2021-04-22 NOTE — Telephone Encounter (Signed)
Spoke with patient and advised her that we do have vaccine available in office. KW

## 2021-04-23 DIAGNOSIS — M545 Low back pain, unspecified: Secondary | ICD-10-CM | POA: Diagnosis not present

## 2021-04-23 DIAGNOSIS — M25552 Pain in left hip: Secondary | ICD-10-CM | POA: Diagnosis not present

## 2021-04-23 DIAGNOSIS — M25551 Pain in right hip: Secondary | ICD-10-CM | POA: Diagnosis not present

## 2021-04-23 DIAGNOSIS — M6281 Muscle weakness (generalized): Secondary | ICD-10-CM | POA: Diagnosis not present

## 2021-04-27 ENCOUNTER — Telehealth: Payer: Self-pay | Admitting: Family Medicine

## 2021-04-27 NOTE — Telephone Encounter (Signed)
Pt is calling to schedule COVID booster. Pt states she has been advised 3 time that the schedule is not available. And someone with call her back. Please advise CB- [phone]

## 2021-04-28 ENCOUNTER — Other Ambulatory Visit: Payer: Self-pay

## 2021-04-28 ENCOUNTER — Ambulatory Visit (INDEPENDENT_AMBULATORY_CARE_PROVIDER_SITE_OTHER): Payer: BC Managed Care – PPO

## 2021-04-28 DIAGNOSIS — M6281 Muscle weakness (generalized): Secondary | ICD-10-CM | POA: Diagnosis not present

## 2021-04-28 DIAGNOSIS — Z23 Encounter for immunization: Secondary | ICD-10-CM

## 2021-04-28 DIAGNOSIS — M25551 Pain in right hip: Secondary | ICD-10-CM | POA: Diagnosis not present

## 2021-04-28 DIAGNOSIS — M545 Low back pain, unspecified: Secondary | ICD-10-CM | POA: Diagnosis not present

## 2021-04-28 DIAGNOSIS — M25552 Pain in left hip: Secondary | ICD-10-CM | POA: Diagnosis not present

## 2021-04-30 DIAGNOSIS — R1032 Left lower quadrant pain: Secondary | ICD-10-CM | POA: Diagnosis not present

## 2021-04-30 DIAGNOSIS — R1030 Lower abdominal pain, unspecified: Secondary | ICD-10-CM | POA: Diagnosis not present

## 2021-04-30 DIAGNOSIS — M545 Low back pain, unspecified: Secondary | ICD-10-CM | POA: Diagnosis not present

## 2021-04-30 DIAGNOSIS — M6281 Muscle weakness (generalized): Secondary | ICD-10-CM | POA: Diagnosis not present

## 2021-04-30 DIAGNOSIS — M25551 Pain in right hip: Secondary | ICD-10-CM | POA: Diagnosis not present

## 2021-04-30 DIAGNOSIS — M160 Bilateral primary osteoarthritis of hip: Secondary | ICD-10-CM | POA: Diagnosis not present

## 2021-04-30 DIAGNOSIS — M5416 Radiculopathy, lumbar region: Secondary | ICD-10-CM | POA: Diagnosis not present

## 2021-04-30 DIAGNOSIS — R1031 Right lower quadrant pain: Secondary | ICD-10-CM | POA: Diagnosis not present

## 2021-04-30 DIAGNOSIS — M25552 Pain in left hip: Secondary | ICD-10-CM | POA: Diagnosis not present

## 2021-04-30 DIAGNOSIS — M5136 Other intervertebral disc degeneration, lumbar region: Secondary | ICD-10-CM | POA: Diagnosis not present

## 2021-05-05 DIAGNOSIS — M25552 Pain in left hip: Secondary | ICD-10-CM | POA: Diagnosis not present

## 2021-05-05 DIAGNOSIS — M25551 Pain in right hip: Secondary | ICD-10-CM | POA: Diagnosis not present

## 2021-05-05 DIAGNOSIS — M6281 Muscle weakness (generalized): Secondary | ICD-10-CM | POA: Diagnosis not present

## 2021-05-05 DIAGNOSIS — M545 Low back pain, unspecified: Secondary | ICD-10-CM | POA: Diagnosis not present

## 2021-05-07 DIAGNOSIS — M6281 Muscle weakness (generalized): Secondary | ICD-10-CM | POA: Diagnosis not present

## 2021-05-07 DIAGNOSIS — M25552 Pain in left hip: Secondary | ICD-10-CM | POA: Diagnosis not present

## 2021-05-07 DIAGNOSIS — M545 Low back pain, unspecified: Secondary | ICD-10-CM | POA: Diagnosis not present

## 2021-05-07 DIAGNOSIS — M25551 Pain in right hip: Secondary | ICD-10-CM | POA: Diagnosis not present

## 2021-05-10 DIAGNOSIS — M6281 Muscle weakness (generalized): Secondary | ICD-10-CM | POA: Diagnosis not present

## 2021-05-10 DIAGNOSIS — M545 Low back pain, unspecified: Secondary | ICD-10-CM | POA: Diagnosis not present

## 2021-05-10 DIAGNOSIS — M25552 Pain in left hip: Secondary | ICD-10-CM | POA: Diagnosis not present

## 2021-05-10 DIAGNOSIS — M25551 Pain in right hip: Secondary | ICD-10-CM | POA: Diagnosis not present

## 2021-05-19 DIAGNOSIS — M25552 Pain in left hip: Secondary | ICD-10-CM | POA: Diagnosis not present

## 2021-05-19 DIAGNOSIS — M6281 Muscle weakness (generalized): Secondary | ICD-10-CM | POA: Diagnosis not present

## 2021-05-19 DIAGNOSIS — M25551 Pain in right hip: Secondary | ICD-10-CM | POA: Diagnosis not present

## 2021-05-19 DIAGNOSIS — M545 Low back pain, unspecified: Secondary | ICD-10-CM | POA: Diagnosis not present

## 2021-05-21 DIAGNOSIS — M545 Low back pain, unspecified: Secondary | ICD-10-CM | POA: Diagnosis not present

## 2021-05-21 DIAGNOSIS — M25552 Pain in left hip: Secondary | ICD-10-CM | POA: Diagnosis not present

## 2021-05-21 DIAGNOSIS — M25551 Pain in right hip: Secondary | ICD-10-CM | POA: Diagnosis not present

## 2021-05-21 DIAGNOSIS — M6281 Muscle weakness (generalized): Secondary | ICD-10-CM | POA: Diagnosis not present

## 2021-05-25 ENCOUNTER — Other Ambulatory Visit: Payer: Self-pay | Admitting: Physician Assistant

## 2021-05-25 DIAGNOSIS — K219 Gastro-esophageal reflux disease without esophagitis: Secondary | ICD-10-CM

## 2021-05-26 DIAGNOSIS — M545 Low back pain, unspecified: Secondary | ICD-10-CM | POA: Diagnosis not present

## 2021-05-26 DIAGNOSIS — M25552 Pain in left hip: Secondary | ICD-10-CM | POA: Diagnosis not present

## 2021-05-26 DIAGNOSIS — M6281 Muscle weakness (generalized): Secondary | ICD-10-CM | POA: Diagnosis not present

## 2021-05-26 DIAGNOSIS — M25551 Pain in right hip: Secondary | ICD-10-CM | POA: Diagnosis not present

## 2021-05-26 MED ORDER — OMEPRAZOLE 40 MG PO CPDR: 40.0000 mg | DELAYED_RELEASE_CAPSULE | Freq: Every day | ORAL | 0 refills | Status: DC

## 2021-05-28 DIAGNOSIS — M25551 Pain in right hip: Secondary | ICD-10-CM | POA: Diagnosis not present

## 2021-05-28 DIAGNOSIS — M6281 Muscle weakness (generalized): Secondary | ICD-10-CM | POA: Diagnosis not present

## 2021-05-28 DIAGNOSIS — M25552 Pain in left hip: Secondary | ICD-10-CM | POA: Diagnosis not present

## 2021-05-28 DIAGNOSIS — M545 Low back pain, unspecified: Secondary | ICD-10-CM | POA: Diagnosis not present

## 2021-06-04 DIAGNOSIS — M25552 Pain in left hip: Secondary | ICD-10-CM | POA: Diagnosis not present

## 2021-06-04 DIAGNOSIS — M25551 Pain in right hip: Secondary | ICD-10-CM | POA: Diagnosis not present

## 2021-06-04 DIAGNOSIS — M6281 Muscle weakness (generalized): Secondary | ICD-10-CM | POA: Diagnosis not present

## 2021-06-04 DIAGNOSIS — M545 Low back pain, unspecified: Secondary | ICD-10-CM | POA: Diagnosis not present

## 2021-06-09 DIAGNOSIS — M6281 Muscle weakness (generalized): Secondary | ICD-10-CM | POA: Diagnosis not present

## 2021-06-09 DIAGNOSIS — M25551 Pain in right hip: Secondary | ICD-10-CM | POA: Diagnosis not present

## 2021-06-09 DIAGNOSIS — M25552 Pain in left hip: Secondary | ICD-10-CM | POA: Diagnosis not present

## 2021-06-09 DIAGNOSIS — M545 Low back pain, unspecified: Secondary | ICD-10-CM | POA: Diagnosis not present

## 2021-06-11 DIAGNOSIS — M6281 Muscle weakness (generalized): Secondary | ICD-10-CM | POA: Diagnosis not present

## 2021-06-11 DIAGNOSIS — M25551 Pain in right hip: Secondary | ICD-10-CM | POA: Diagnosis not present

## 2021-06-11 DIAGNOSIS — M545 Low back pain, unspecified: Secondary | ICD-10-CM | POA: Diagnosis not present

## 2021-06-11 DIAGNOSIS — M25552 Pain in left hip: Secondary | ICD-10-CM | POA: Diagnosis not present

## 2021-06-16 DIAGNOSIS — M6281 Muscle weakness (generalized): Secondary | ICD-10-CM | POA: Diagnosis not present

## 2021-06-16 DIAGNOSIS — M25551 Pain in right hip: Secondary | ICD-10-CM | POA: Diagnosis not present

## 2021-06-16 DIAGNOSIS — M545 Low back pain, unspecified: Secondary | ICD-10-CM | POA: Diagnosis not present

## 2021-06-16 DIAGNOSIS — M25552 Pain in left hip: Secondary | ICD-10-CM | POA: Diagnosis not present

## 2021-06-18 DIAGNOSIS — M6281 Muscle weakness (generalized): Secondary | ICD-10-CM | POA: Diagnosis not present

## 2021-06-18 DIAGNOSIS — M25551 Pain in right hip: Secondary | ICD-10-CM | POA: Diagnosis not present

## 2021-06-18 DIAGNOSIS — M545 Low back pain, unspecified: Secondary | ICD-10-CM | POA: Diagnosis not present

## 2021-06-18 DIAGNOSIS — M25552 Pain in left hip: Secondary | ICD-10-CM | POA: Diagnosis not present

## 2021-06-25 DIAGNOSIS — M6281 Muscle weakness (generalized): Secondary | ICD-10-CM | POA: Diagnosis not present

## 2021-06-25 DIAGNOSIS — M25551 Pain in right hip: Secondary | ICD-10-CM | POA: Diagnosis not present

## 2021-06-25 DIAGNOSIS — M545 Low back pain, unspecified: Secondary | ICD-10-CM | POA: Diagnosis not present

## 2021-06-25 DIAGNOSIS — M25552 Pain in left hip: Secondary | ICD-10-CM | POA: Diagnosis not present

## 2021-06-30 DIAGNOSIS — M6281 Muscle weakness (generalized): Secondary | ICD-10-CM | POA: Diagnosis not present

## 2021-06-30 DIAGNOSIS — M545 Low back pain, unspecified: Secondary | ICD-10-CM | POA: Diagnosis not present

## 2021-06-30 DIAGNOSIS — M25552 Pain in left hip: Secondary | ICD-10-CM | POA: Diagnosis not present

## 2021-06-30 DIAGNOSIS — M25551 Pain in right hip: Secondary | ICD-10-CM | POA: Diagnosis not present

## 2021-07-07 DIAGNOSIS — M25551 Pain in right hip: Secondary | ICD-10-CM | POA: Diagnosis not present

## 2021-07-07 DIAGNOSIS — M25552 Pain in left hip: Secondary | ICD-10-CM | POA: Diagnosis not present

## 2021-07-07 DIAGNOSIS — M545 Low back pain, unspecified: Secondary | ICD-10-CM | POA: Diagnosis not present

## 2021-07-07 DIAGNOSIS — M6281 Muscle weakness (generalized): Secondary | ICD-10-CM | POA: Diagnosis not present

## 2021-07-13 DIAGNOSIS — M1611 Unilateral primary osteoarthritis, right hip: Secondary | ICD-10-CM | POA: Diagnosis not present

## 2021-07-14 DIAGNOSIS — M6281 Muscle weakness (generalized): Secondary | ICD-10-CM | POA: Diagnosis not present

## 2021-07-14 DIAGNOSIS — M25551 Pain in right hip: Secondary | ICD-10-CM | POA: Diagnosis not present

## 2021-07-14 DIAGNOSIS — M545 Low back pain, unspecified: Secondary | ICD-10-CM | POA: Diagnosis not present

## 2021-07-14 DIAGNOSIS — M25552 Pain in left hip: Secondary | ICD-10-CM | POA: Diagnosis not present

## 2021-07-16 ENCOUNTER — Ambulatory Visit: Payer: BC Managed Care – PPO | Admitting: Family Medicine

## 2021-07-16 ENCOUNTER — Encounter: Payer: Self-pay | Admitting: Family Medicine

## 2021-07-16 ENCOUNTER — Other Ambulatory Visit: Payer: Self-pay

## 2021-07-16 VITALS — BP 185/98 | HR 61 | Temp 98.2°F | Resp 16 | Ht 66.0 in | Wt 224.0 lb

## 2021-07-16 DIAGNOSIS — E063 Autoimmune thyroiditis: Secondary | ICD-10-CM

## 2021-07-16 DIAGNOSIS — E038 Other specified hypothyroidism: Secondary | ICD-10-CM

## 2021-07-16 DIAGNOSIS — Z6836 Body mass index (BMI) 36.0-36.9, adult: Secondary | ICD-10-CM

## 2021-07-16 DIAGNOSIS — E7800 Pure hypercholesterolemia, unspecified: Secondary | ICD-10-CM | POA: Diagnosis not present

## 2021-07-16 DIAGNOSIS — E669 Obesity, unspecified: Secondary | ICD-10-CM | POA: Diagnosis not present

## 2021-07-16 DIAGNOSIS — I10 Essential (primary) hypertension: Secondary | ICD-10-CM | POA: Diagnosis not present

## 2021-07-16 MED ORDER — ROSUVASTATIN CALCIUM 5 MG PO TABS: 5.0000 mg | ORAL_TABLET | Freq: Every day | ORAL | 3 refills | Status: DC

## 2021-07-16 MED ORDER — HYDROCHLOROTHIAZIDE 12.5 MG PO TABS: 12.5000 mg | ORAL_TABLET | Freq: Every day | ORAL | 3 refills | Status: DC

## 2021-07-16 NOTE — Assessment & Plan Note (Signed)
Remains uncontrolled and elevated Remains asymptomatic Not currently monitoring home blood pressures, but it was elevated at other offices as well Goal BP less than 140/90 Encourage DASH diet Start HCTZ 12.5 mg daily Check metabolic panel Follow-up in 1 month and consider dose titration

## 2021-07-16 NOTE — Assessment & Plan Note (Signed)
Discussed importance of healthy weight management Discussed diet and exercise  

## 2021-07-16 NOTE — Progress Notes (Signed)
Established patient visit   Patient: Alexis Newman   DOB: Jan 13, 1956   65 y.o. Female  MRN: [id number] Visit Date: 07/16/2021  Today's healthcare provider: Shirlee Latch, MD   Chief Complaint  Patient presents with   Hypertension   Hypothyroidism    Subjective  -------------------------------------------------------------------------------------------------------------------- Hypertension Pertinent negatives include no chest pain, headaches, neck pain, palpitations or shortness of breath.   Hypothyroid, follow-up  Lab Results  Component Value Date   TSH 0.264 (L) 04/15/2021   TSH 0.597 09/25/2018   TSH 0.969 04/06/2016   Wt Readings from Last 3 Encounters:  07/16/21 224 lb (101.6 kg)  04/15/21 228 lb 4.8 oz (103.6 kg)  09/30/20 232 lb 3.2 oz (105.3 kg)    She was last seen for hypothyroid 3 months ago.  Management since that visit includes no changes. She reports good compliance with treatment. She is not having side effects.   Symptoms: No change in energy level No constipation  No diarrhea No heat / cold intolerance  No nervousness No palpitations  No weight changes    ----------------------------------------------------------------------------------------- Follow up for elevated blood pressure  The patient was last seen for this 3 months ago. Changes made at last visit include monitor blood pressure at home. Tuesday she received a cortizone   She hasn't increase exercise because after about 20 min of walking she will have back pain. She has been going to physical therapy for her back pain.   On Tuesday she received a cortisone shot in her hips and her blood pressure was in the 200s.   Upon her last visit it was believed her hypertension was associated with the pain in her back and hips. She is agreeable with beginning HCTZ and she will check-in in 1 month.  She reports having a history of swelling in the ankles and occasionally in the knees. She  hasn't had a gout flare-up since she had knee replacement surgery.   She reports good compliance with treatment. She feels that condition is Unchanged. She is not having side effects.   ----------------------------------------------------------------------------------------- Lipid/Cholesterol, Follow-up  Last lipid panel Other pertinent labs  Lab Results  Component Value Date   CHOL 217 (H) 04/15/2021   HDL 49 04/15/2021   LDLCALC 132 (H) 04/15/2021   TRIG 205 (H) 04/15/2021   CHOLHDL 4.4 04/15/2021   Lab Results  Component Value Date   ALT 31 04/15/2021   AST 24 04/15/2021   PLT 304 09/25/2018   TSH 0.264 (L) 04/15/2021     She was last seen for this 3 months ago.  Management since that visit includes starting on Crestor 5mg  .  She reports good compliance with treatment. She is not having side effects.   Symptoms: No chest pain No chest pressure/discomfort  No dyspnea No lower extremity edema  No numbness or tingling of extremity No orthopnea  No palpitations No paroxysmal nocturnal dyspnea  No speech difficulty No syncope   Current diet: well balanced Current exercise: no regular exercise  The 10-year ASCVD risk score DC Jr., et al., 2013) is: 16.4%`  ---------------------------------------------------------------------------------------------------  Show patient history (optional):23778}   Medications: Outpatient Medications Prior to Visit  Medication Sig   acetaminophen (TYLENOL) 500 MG tablet Take 1 tablet (500 mg total) by mouth every 6 (six) hours as needed.   baclofen (LIORESAL) 10 MG tablet Take 1 tablet (10 mg total) by mouth 3 (three) times daily.   Calcium Carb-Cholecalciferol (CALCIUM 600 + D PO) Take by  mouth.   cholecalciferol (VITAMIN D) 1000 units tablet Take 1 tablet (1,000 Units total) by mouth daily.   levothyroxine (SYNTHROID) 100 MCG tablet Take 1 tablet (100 mcg total) by mouth daily.   omeprazole (PRILOSEC) 40 MG capsule Take 1  capsule (40 mg total) by mouth daily.   [DISCONTINUED] rosuvastatin (CRESTOR) 5 MG tablet Take 1 tablet (5 mg total) by mouth daily.   No facility-administered medications prior to visit.    Review of Systems  Constitutional:  Negative for chills, fatigue and fever.  HENT:  Negative for ear pain, sinus pressure, sinus pain and sore throat.   Eyes:  Negative for pain.  Respiratory:  Negative for cough, chest tightness, shortness of breath and wheezing.   Cardiovascular:  Negative for chest pain, palpitations and leg swelling.  Gastrointestinal:  Negative for abdominal pain, blood in stool, diarrhea, nausea and vomiting.  Genitourinary:  Negative for flank pain, frequency, pelvic pain and urgency.  Musculoskeletal:  Negative for back pain, myalgias and neck pain.  Neurological:  Negative for dizziness, weakness, light-headedness, numbness and headaches.       Objective  -------------------------------------------------------------------------------------------------------------------- BP (!) 185/98   Pulse 61   Temp 98.2 F (36.8 C)   Resp 16   Ht 5\' 6"  (1.676 m)   Wt 224 lb (101.6 kg)   BMI 36.15 kg/m  BP Readings from Last 3 Encounters:  07/16/21 (!) 185/98  04/15/21 (!) 151/89  09/30/20 (!) 148/94   Wt Readings from Last 3 Encounters:  07/16/21 224 lb (101.6 kg)  04/15/21 228 lb 4.8 oz (103.6 kg)  09/30/20 232 lb 3.2 oz (105.3 kg)       Physical Exam Vitals reviewed.  Constitutional:      General: She is not in acute distress.    Appearance: Normal appearance. She is well-developed. She is not diaphoretic.  HENT:     Head: Normocephalic and atraumatic.  Eyes:     General: No scleral icterus.    Conjunctiva/sclera: Conjunctivae normal.  Neck:     Thyroid: No thyromegaly.  Cardiovascular:     Rate and Rhythm: Normal rate and regular rhythm.     Pulses: Normal pulses.     Heart sounds: Normal heart sounds. No murmur heard. Pulmonary:     Effort: Pulmonary  effort is normal. No respiratory distress.     Breath sounds: Normal breath sounds. No wheezing, rhonchi or rales.  Musculoskeletal:     Cervical back: Neck supple.     Right lower leg: No edema.     Left lower leg: No edema.  Lymphadenopathy:     Cervical: No cervical adenopathy.  Skin:    General: Skin is warm and dry.     Findings: No rash.  Neurological:     Mental Status: She is alert and oriented to person, place, and time. Mental status is at baseline.  Psychiatric:        Mood and Affect: Mood normal.        Behavior: Behavior normal.     No results found for any visits on 07/16/21.  Assessment & Plan  ---------------------------------------------------------------------------------------------------------------------- Problem List Items Addressed This Visit       Cardiovascular and Mediastinum   Essential hypertension    Remains uncontrolled and elevated Remains asymptomatic Not currently monitoring home blood pressures, but it was elevated at other offices as well Goal BP less than 140/90 Encourage DASH diet Start HCTZ 12.5 mg daily Check metabolic panel Follow-up in 1 month and consider dose  titration      Relevant Medications   hydrochlorothiazide (HYDRODIURIL) 12.5 MG tablet   rosuvastatin (CRESTOR) 5 MG tablet   Other Relevant Orders   Comprehensive metabolic panel     Endocrine   Hypothyroidism - Primary    Previously with low TSH and decreased synthroid to daily Recheck TSH      Relevant Orders   TSH     Other   Hypercholesterolemia without hypertriglyceridemia    Uncontrolled on last lipid panel Started Crestor She is tolerating this well and we will continue Recheck lipid panel to see improvement      Relevant Medications   hydrochlorothiazide (HYDRODIURIL) 12.5 MG tablet   rosuvastatin (CRESTOR) 5 MG tablet   Other Relevant Orders   Lipid panel   Comprehensive metabolic panel   Class 2 obesity without serious comorbidity with  body mass index (BMI) of 36.0 to 36.9 in adult    Discussed importance of healthy weight management Discussed diet and exercise        Return in about 4 weeks (around 08/13/2021) for BP f/u.      I,Essence Turner,acting as a Neurosurgeon for Shirlee Latch, MD.,have documented all relevant documentation on the behalf of Shirlee Latch, MD,as directed by  Shirlee Latch, MD while in the presence of Shirlee Latch, MD.   I, Shirlee Latch, MD, have reviewed all documentation for this visit. The documentation on 07/16/21 for the exam, diagnosis, procedures, and orders are all accurate and complete.   Zalmen Wrightsman, Marzella Schlein, MD, MPH [facility]

## 2021-07-16 NOTE — Assessment & Plan Note (Signed)
Previously with low TSH and decreased synthroid to daily Recheck TSH

## 2021-07-16 NOTE — Assessment & Plan Note (Signed)
Uncontrolled on last lipid panel Started Crestor She is tolerating this well and we will continue Recheck lipid panel to see improvement

## 2021-07-20 LAB — LIPID PANEL
Chol/HDL Ratio: 2.8 ratio (ref 0.0–4.4)
Cholesterol, Total: 161 mg/dL (ref 100–199)
HDL: 58 mg/dL (ref >39)
LDL Chol Calc (NIH): 81 mg/dL (ref 0–99)
Triglycerides: 123 mg/dL (ref 0–149)
VLDL Cholesterol Cal: 22 mg/dL (ref 5–40)

## 2021-07-20 LAB — COMPREHENSIVE METABOLIC PANEL
ALT: 25 IU/L (ref 0–32)
AST: 18 IU/L (ref 0–40)
Albumin/Globulin Ratio: 1.9 (ref 1.2–2.2)
Albumin: 4.5 g/dL (ref 3.8–4.8)
Alkaline Phosphatase: 75 IU/L (ref 44–121)
BUN/Creatinine Ratio: 22 (ref 12–28)
BUN: 20 mg/dL (ref 8–27)
Bilirubin Total: 0.2 mg/dL (ref 0.0–1.2)
CO2: 19 mmol/L — ABNORMAL LOW (ref 20–29)
Calcium: 9.5 mg/dL (ref 8.7–10.3)
Chloride: 103 mmol/L (ref 96–106)
Creatinine, Ser: 0.89 mg/dL (ref 0.57–1.00)
Globulin, Total: 2.4 g/dL (ref 1.5–4.5)
Glucose: 99 mg/dL (ref 65–99)
Potassium: 4.4 mmol/L (ref 3.5–5.2)
Sodium: 140 mmol/L (ref 134–144)
Total Protein: 6.9 g/dL (ref 6.0–8.5)
eGFR: 72 mL/min/{1.73_m2} (ref >59)

## 2021-07-20 LAB — TSH: TSH: 2.56 u[IU]/mL (ref 0.450–4.500)

## 2021-08-06 ENCOUNTER — Other Ambulatory Visit: Payer: Self-pay | Admitting: Family Medicine

## 2021-08-06 DIAGNOSIS — K219 Gastro-esophageal reflux disease without esophagitis: Secondary | ICD-10-CM

## 2021-08-12 ENCOUNTER — Other Ambulatory Visit: Payer: Self-pay

## 2021-08-12 ENCOUNTER — Encounter: Payer: Self-pay | Admitting: Family Medicine

## 2021-08-12 ENCOUNTER — Ambulatory Visit: Payer: BC Managed Care – PPO | Admitting: Family Medicine

## 2021-08-12 VITALS — BP 158/100 | HR 82 | Temp 97.8°F | Ht 66.0 in | Wt 223.4 lb

## 2021-08-12 DIAGNOSIS — E669 Obesity, unspecified: Secondary | ICD-10-CM

## 2021-08-12 DIAGNOSIS — Z1231 Encounter for screening mammogram for malignant neoplasm of breast: Secondary | ICD-10-CM | POA: Diagnosis not present

## 2021-08-12 DIAGNOSIS — E7800 Pure hypercholesterolemia, unspecified: Secondary | ICD-10-CM | POA: Diagnosis not present

## 2021-08-12 DIAGNOSIS — I10 Essential (primary) hypertension: Secondary | ICD-10-CM | POA: Diagnosis not present

## 2021-08-12 DIAGNOSIS — Z6836 Body mass index (BMI) 36.0-36.9, adult: Secondary | ICD-10-CM

## 2021-08-12 MED ORDER — HYDROCHLOROTHIAZIDE 25 MG PO TABS: 25.0000 mg | ORAL_TABLET | Freq: Every day | ORAL | 1 refills | Status: DC

## 2021-08-12 NOTE — Progress Notes (Signed)
Established patient visit   Patient: Alexis Newman   DOB: [birth year]   65 y.o. Female  MRN: [id number] Visit Date: 08/12/2021  Today's healthcare provider: Shirlee Latch, MD   Chief Complaint  Patient presents with   Follow-up   Hypertension    Subjective    HPI   Hypertension, follow-up  BP Readings from Last 3 Encounters:  08/12/21 (!) 158/100  07/16/21 (!) 185/98  04/15/21 (!) 151/89   Wt Readings from Last 3 Encounters:  08/12/21 223 lb 6.4 oz (101.3 kg)  07/16/21 224 lb (101.6 kg)  04/15/21 228 lb 4.8 oz (103.6 kg)     She was last seen for hypertension 1 months ago.  BP at that visit was 185/98. Management since that visit includes Encourage DASH diet  Start HCTZ 12.5 mg daily. She is tolerating the medication well. She denies increased frequency in relation to the medication.  She reports good compliance with treatment. She is not having side effects.  She is following a Regular, Low Sodium diet. She is not exercising. She just walks around campus.  She does not smoke.  Use of agents associated with hypertension: none.   Outside blood pressures are none. Symptoms: No chest pain No chest pressure  No palpitations No syncope  No dyspnea No orthopnea  No paroxysmal nocturnal dyspnea No lower extremity edema   Pertinent labs: Lab Results  Component Value Date   CHOL 161 07/16/2021   HDL 58 07/16/2021   LDLCALC 81 07/16/2021   TRIG 123 07/16/2021   CHOLHDL 2.8 07/16/2021   Lab Results  Component Value Date   NA 140 07/16/2021   K 4.4 07/16/2021   CREATININE 0.89 07/16/2021   GFRNONAA 66 09/25/2018   GFRAA 77 09/25/2018   GLUCOSE 99 07/16/2021     The 10-year ASCVD risk score (Arnett DK, et al., 2019) is: 9.8%   Flu Vaccine - 08/06/21 She is amenable to scheduling a mammogram. Last recent 05/10/18 ---------------------------------------------------------------------------------------------------     Medications: Outpatient  Medications Prior to Visit  Medication Sig   acetaminophen (TYLENOL) 500 MG tablet Take 1 tablet (500 mg total) by mouth every 6 (six) hours as needed.   baclofen (LIORESAL) 10 MG tablet Take 1 tablet (10 mg total) by mouth 3 (three) times daily.   cholecalciferol (VITAMIN D) 1000 units tablet Take 1 tablet (1,000 Units total) by mouth daily.   levothyroxine (SYNTHROID) 100 MCG tablet Take 1 tablet (100 mcg total) by mouth daily.   omeprazole (PRILOSEC) 40 MG capsule TAKE 1 CAPSULE DAILY   rosuvastatin (CRESTOR) 5 MG tablet Take 1 tablet (5 mg total) by mouth daily.   [DISCONTINUED] hydrochlorothiazide (HYDRODIURIL) 12.5 MG tablet Take 1 tablet (12.5 mg total) by mouth daily.   Calcium Carb-Cholecalciferol (CALCIUM 600 + D PO) Take by mouth.   No facility-administered medications prior to visit.    Review of Systems  Constitutional:  Negative for chills, fatigue and fever.  HENT:  Negative for ear pain, sinus pressure, sinus pain and sore throat.   Eyes:  Negative for pain and visual disturbance.  Respiratory:  Negative for cough, chest tightness, shortness of breath and wheezing.   Cardiovascular:  Negative for chest pain, palpitations and leg swelling.  Gastrointestinal:  Negative for abdominal pain, blood in stool, diarrhea, nausea and vomiting.  Genitourinary:  Negative for dysuria, flank pain, frequency, pelvic pain and urgency.  Musculoskeletal:  Negative for back pain, myalgias and neck pain.  Neurological:  Negative for  dizziness, weakness, light-headedness, numbness and headaches.   Last CBC Lab Results  Component Value Date   WBC 8.2 09/25/2018   HGB 12.9 09/25/2018   HCT 38.7 09/25/2018   MCV 86 09/25/2018   MCH 28.7 09/25/2018   RDW 12.8 09/25/2018   PLT 304 09/25/2018   Last metabolic panel Lab Results  Component Value Date   GLUCOSE 99 07/16/2021   NA 140 07/16/2021   K 4.4 07/16/2021   CL 103 07/16/2021   CO2 19 (L) 07/16/2021   BUN 20 07/16/2021    CREATININE 0.89 07/16/2021   GFRNONAA 66 09/25/2018   GFRAA 77 09/25/2018   CALCIUM 9.5 07/16/2021   PROT 6.9 07/16/2021   ALBUMIN 4.5 07/16/2021   LABGLOB 2.4 07/16/2021   AGRATIO 1.9 07/16/2021   BILITOT 0.2 07/16/2021   ALKPHOS 75 07/16/2021   AST 18 07/16/2021   ALT 25 07/16/2021   Last lipids Lab Results  Component Value Date   CHOL 161 07/16/2021   HDL 58 07/16/2021   LDLCALC 81 07/16/2021   TRIG 123 07/16/2021   CHOLHDL 2.8 07/16/2021   Last hemoglobin A1c Lab Results  Component Value Date   HGBA1C 6.2 (H) 04/06/2016   Last thyroid functions Lab Results  Component Value Date   TSH 2.560 07/16/2021   Last vitamin D Lab Results  Component Value Date   VD25OH 27.4 (L) 04/15/2021   Last vitamin B12 and Folate Lab Results  Component Value Date   VITAMINB12 391 05/18/2015       Objective    BP (!) 158/100 (BP Location: Right Arm, Patient Position: Sitting, Cuff Size: Large)   Pulse 82   Temp 97.8 F (36.6 C) (Oral)   Ht 5\' 6"  (1.676 m)   Wt 223 lb 6.4 oz (101.3 kg)   SpO2 98%   BMI 36.06 kg/m  BP Readings from Last 3 Encounters:  08/12/21 (!) 158/100  07/16/21 (!) 185/98  04/15/21 (!) 151/89   Wt Readings from Last 3 Encounters:  08/12/21 223 lb 6.4 oz (101.3 kg)  07/16/21 224 lb (101.6 kg)  04/15/21 228 lb 4.8 oz (103.6 kg)      Physical Exam Vitals reviewed.  Constitutional:      General: She is not in acute distress.    Appearance: Normal appearance. She is well-developed. She is not diaphoretic.  HENT:     Head: Normocephalic and atraumatic.  Eyes:     General: No scleral icterus.    Conjunctiva/sclera: Conjunctivae normal.  Neck:     Thyroid: No thyromegaly.  Cardiovascular:     Rate and Rhythm: Normal rate and regular rhythm.     Pulses: Normal pulses.     Heart sounds: Normal heart sounds. No murmur heard. Pulmonary:     Effort: Pulmonary effort is normal. No respiratory distress.     Breath sounds: Normal breath sounds. No  wheezing, rhonchi or rales.  Musculoskeletal:     Cervical back: Neck supple.     Right lower leg: No edema.     Left lower leg: No edema.  Lymphadenopathy:     Cervical: No cervical adenopathy.  Skin:    General: Skin is warm and dry.     Findings: No rash.  Neurological:     Mental Status: She is alert and oriented to person, place, and time. Mental status is at baseline.  Psychiatric:        Mood and Affect: Mood normal.        Behavior: Behavior  normal.      No results found for any visits on 08/12/21.  Assessment & Plan     Problem List Items Addressed This Visit       Cardiovascular and Mediastinum   Essential hypertension - Primary    Improving, but remains elevated Increase HCTZ to 25mg  daily Recheck BMP F/u in 33m or sooner prn      Relevant Medications   hydrochlorothiazide (HYDRODIURIL) 25 MG tablet   Other Relevant Orders   Basic Metabolic Panel (BMET)     Other   Hypercholesterolemia without hypertriglyceridemia    Well controlled on last lipid panel - reviewed      Relevant Medications   hydrochlorothiazide (HYDRODIURIL) 25 MG tablet   Class 2 obesity without serious comorbidity with body mass index (BMI) of 36.0 to 36.9 in adult    Discussed importance of healthy weight management Discussed diet and exercise       Other Visit Diagnoses     Encounter for screening mammogram for malignant neoplasm of breast       Relevant Orders   MM 3D SCREEN BREAST BILATERAL       Return in about 2 months (around 10/12/2021) for chronic disease f/u.      I,Essence Turner,acting as a 10/14/2021 for Neurosurgeon, MD.,have documented all relevant documentation on the behalf of Shirlee Latch, MD,as directed by  Shirlee Latch, MD while in the presence of Shirlee Latch, MD.  I, Shirlee Latch, MD, have reviewed all documentation for this visit. The documentation on 08/12/21 for the exam, diagnosis, procedures, and orders are all accurate and  complete.   Deandre Stansel, 08/14/21, MD, MPH [facility]

## 2021-08-12 NOTE — Assessment & Plan Note (Signed)
Discussed importance of healthy weight management Discussed diet and exercise  

## 2021-08-12 NOTE — Assessment & Plan Note (Signed)
Improving, but remains elevated Increase HCTZ to 25mg  daily Recheck BMP F/u in 28m or sooner prn

## 2021-08-12 NOTE — Assessment & Plan Note (Signed)
Well controlled on last lipid panel - reviewed

## 2021-08-12 NOTE — Patient Instructions (Signed)

## 2021-08-13 LAB — BASIC METABOLIC PANEL
BUN/Creatinine Ratio: 16 (ref 12–28)
BUN: 15 mg/dL (ref 8–27)
CO2: 27 mmol/L (ref 20–29)
Calcium: 9.7 mg/dL (ref 8.7–10.3)
Chloride: 98 mmol/L (ref 96–106)
Creatinine, Ser: 0.92 mg/dL (ref 0.57–1.00)
Glucose: 85 mg/dL (ref 65–99)
Potassium: 4 mmol/L (ref 3.5–5.2)
Sodium: 139 mmol/L (ref 134–144)
eGFR: 69 mL/min/{1.73_m2} (ref >59)

## 2021-09-14 ENCOUNTER — Ambulatory Visit
Admission: RE | Admit: 2021-09-14 | Discharge: 2021-09-14 | Disposition: A | Discharge status: Home / Self Care | Payer: BC Managed Care – PPO | Source: Ambulatory Visit | Attending: Family Medicine | Admitting: Family Medicine

## 2021-09-14 ENCOUNTER — Other Ambulatory Visit: Payer: Self-pay

## 2021-09-14 DIAGNOSIS — Z1231 Encounter for screening mammogram for malignant neoplasm of breast: Secondary | ICD-10-CM

## 2021-10-10 ENCOUNTER — Other Ambulatory Visit: Payer: Self-pay | Admitting: Physician Assistant

## 2021-10-10 DIAGNOSIS — M62838 Other muscle spasm: Secondary | ICD-10-CM

## 2021-10-14 ENCOUNTER — Other Ambulatory Visit: Payer: Self-pay

## 2021-10-14 ENCOUNTER — Encounter: Payer: Self-pay | Admitting: Family Medicine

## 2021-10-14 ENCOUNTER — Ambulatory Visit: Payer: BC Managed Care – PPO | Admitting: Family Medicine

## 2021-10-14 VITALS — BP 122/88 | HR 80 | Temp 97.3°F | Resp 16 | Wt 220.5 lb

## 2021-10-14 DIAGNOSIS — M65311 Trigger thumb, right thumb: Secondary | ICD-10-CM

## 2021-10-14 DIAGNOSIS — Z6835 Body mass index (BMI) 35.0-35.9, adult: Secondary | ICD-10-CM | POA: Diagnosis not present

## 2021-10-14 DIAGNOSIS — M65319 Trigger thumb, unspecified thumb: Secondary | ICD-10-CM | POA: Insufficient documentation

## 2021-10-14 DIAGNOSIS — I10 Essential (primary) hypertension: Secondary | ICD-10-CM

## 2021-10-14 NOTE — Progress Notes (Signed)
Established patient visit   Patient: Alexis Newman   DOB: November 26, 1956   65 y.o. Female  MRN: [id number] Visit Date: 10/14/2021  Today's healthcare provider: Lavon Paganini, MD   Chief Complaint  Patient presents with   Hypertension   Subjective    HPI  Hypertension, follow-up  BP Readings from Last 3 Encounters:  10/14/21 122/88  08/12/21 (!) 158/100  07/16/21 (!) 185/98   Wt Readings from Last 3 Encounters:  10/14/21 220 lb 8 oz (100 kg)  08/12/21 223 lb 6.4 oz (101.3 kg)  07/16/21 224 lb (101.6 kg)     She was last seen for hypertension 2 months ago.  BP at that visit was 158/100. Management since that visit includes increase HCTZ to 49m.  She reports good compliance with treatment. She is not having side effects.  She is following a Low Sodium diet.  She does not smoke.  Use of agents associated with hypertension: none.   Outside blood pressures are <140/90 for the most part. Symptoms: No chest pain No chest pressure  No palpitations No syncope  No dyspnea No orthopnea  No paroxysmal nocturnal dyspnea No lower extremity edema   Pertinent labs: Lab Results  Component Value Date   CHOL 161 07/16/2021   HDL 58 07/16/2021   LDLCALC 81 07/16/2021   TRIG 123 07/16/2021   CHOLHDL 2.8 07/16/2021   Lab Results  Component Value Date   NA 139 08/12/2021   K 4.0 08/12/2021   CREATININE 0.92 08/12/2021   EGFR 69 08/12/2021   GLUCOSE 85 08/12/2021   TSH 2.560 07/16/2021     The 10-year ASCVD risk score (Arnett DK, et al., 2019) is: 6.7%   ---------------------------------------------------------------------------------------------------    Medications: Outpatient Medications Prior to Visit  Medication Sig   acetaminophen (TYLENOL) 500 MG tablet Take 1 tablet (500 mg total) by mouth every 6 (six) hours as needed.   baclofen (LIORESAL) 10 MG tablet Take 1 tablet (10 mg total) by mouth 3 (three) times daily.   Calcium Carb-Cholecalciferol  (CALCIUM 600 + D PO) Take by mouth.   cholecalciferol (VITAMIN D) 1000 units tablet Take 1 tablet (1,000 Units total) by mouth daily.   hydrochlorothiazide (HYDRODIURIL) 25 MG tablet Take 1 tablet (25 mg total) by mouth daily.   levothyroxine (SYNTHROID) 100 MCG tablet Take 1 tablet (100 mcg total) by mouth daily.   omeprazole (PRILOSEC) 40 MG capsule TAKE 1 CAPSULE DAILY   rosuvastatin (CRESTOR) 5 MG tablet Take 1 tablet (5 mg total) by mouth daily.   No facility-administered medications prior to visit.    Review of Systems  Constitutional: Negative.   Respiratory: Negative.    Cardiovascular: Negative.   Neurological: Negative.        Objective    BP 122/88 (BP Location: Right Arm, Patient Position: Sitting, Cuff Size: Large)   Pulse 80   Temp (!) 97.3 F (36.3 C) (Temporal)   Resp 16   Wt 220 lb 8 oz (100 kg)   SpO2 96%   BMI 35.59 kg/m  BP Readings from Last 3 Encounters:  10/14/21 122/88  08/12/21 (!) 158/100  07/16/21 (!) 185/98   Wt Readings from Last 3 Encounters:  10/14/21 220 lb 8 oz (100 kg)  08/12/21 223 lb 6.4 oz (101.3 kg)  07/16/21 224 lb (101.6 kg)      Physical Exam Vitals reviewed.  Constitutional:      General: She is not in acute distress.    Appearance:  Normal appearance. She is well-developed. She is not diaphoretic.  HENT:     Head: Normocephalic and atraumatic.  Eyes:     General: No scleral icterus.    Conjunctiva/sclera: Conjunctivae normal.  Neck:     Thyroid: No thyromegaly.  Cardiovascular:     Rate and Rhythm: Normal rate and regular rhythm.     Pulses: Normal pulses.     Heart sounds: Normal heart sounds. No murmur heard. Pulmonary:     Effort: Pulmonary effort is normal. No respiratory distress.     Breath sounds: Normal breath sounds. No wheezing, rhonchi or rales.  Musculoskeletal:     Cervical back: Neck supple.     Right lower leg: No edema.     Left lower leg: No edema.  Lymphadenopathy:     Cervical: No cervical  adenopathy.  Skin:    General: Skin is warm and dry.     Findings: No rash.  Neurological:     Mental Status: She is alert and oriented to person, place, and time. Mental status is at baseline.  Psychiatric:        Mood and Affect: Mood normal.        Behavior: Behavior normal.      No results found for any visits on 10/14/21.  Assessment & Plan     Problem List Items Addressed This Visit       Cardiovascular and Mediastinum   Essential hypertension - Primary    Well controlled Continue current medications Reviewed recent metabolic panel F/u in 4 months         Musculoskeletal and Integument   Trigger thumb    Occurred after overuse however doing better now with rest and bracing No catching on exam and no thickening of the tendon apparent on exam        Other   Obesity    Discussed importance of healthy weight management Discussed diet and exercise         Return in about 4 months (around 02/11/2022) for CPE.      I, Lavon Paganini, MD, have reviewed all documentation for this visit. The documentation on 10/14/21 for the exam, diagnosis, procedures, and orders are all accurate and complete.   Colin Norment, Dionne Bucy, MD, MPH [facility]

## 2021-10-14 NOTE — Assessment & Plan Note (Signed)
Discussed importance of healthy weight management Discussed diet and exercise  

## 2021-10-14 NOTE — Assessment & Plan Note (Signed)
Well controlled Continue current medications Reviewed recent metabolic panel F/u in 4 months  

## 2021-10-14 NOTE — Assessment & Plan Note (Signed)
Occurred after overuse however doing better now with rest and bracing No catching on exam and no thickening of the tendon apparent on exam

## 2021-10-16 ENCOUNTER — Other Ambulatory Visit: Payer: Self-pay | Admitting: Family Medicine

## 2021-10-16 DIAGNOSIS — E038 Other specified hypothyroidism: Secondary | ICD-10-CM

## 2021-10-16 NOTE — Telephone Encounter (Signed)
Requested Prescriptions  Pending Prescriptions Disp Refills  . levothyroxine (SYNTHROID) 100 MCG tablet [Pharmacy Med Name: LEVOTHYROXINE 100 MCG TABLET] 90 tablet 1    Sig: TAKE 1 TABLET BY MOUTH EVERY DAY     Endocrinology:  Hypothyroid Agents Failed - 10/16/2021 12:46 AM      Failed - TSH needs to be rechecked within 3 months after an abnormal result. Refill until TSH is due.      Passed - TSH in normal range and within 360 days    TSH  Date Value Ref Range Status  07/16/2021 2.560 0.450 - 4.500 uIU/mL Final         Passed - Valid encounter within last 12 months    Recent Outpatient Visits          2 days ago Essential hypertension   [facility] [place], Marzella Schlein, MD   2 months ago Essential hypertension   [facility] [place], Marzella Schlein, MD   3 months ago Hypothyroidism due to Hashimoto's thyroiditis   [facility], Marzella Schlein, MD   6 months ago Hypothyroidism due to Hashimoto's thyroiditis   [facility], Marzella Schlein, MD   7 months ago Cough   [facility] [place], Alessandra Bevels, [place]      Future Appointments            In 5 months Bacigalupo, Marzella Schlein, MD [facility], [facility]

## 2021-11-05 ENCOUNTER — Other Ambulatory Visit: Payer: Self-pay | Admitting: Family Medicine

## 2021-11-05 DIAGNOSIS — K219 Gastro-esophageal reflux disease without esophagitis: Secondary | ICD-10-CM

## 2021-11-05 NOTE — Telephone Encounter (Signed)
Requested Prescriptions  Pending Prescriptions Disp Refills  . omeprazole (PRILOSEC) 40 MG capsule [Pharmacy Med Name: OMEPRAZOLE DR CAPS 40MG ] 90 capsule 3    Sig: TAKE 1 CAPSULE DAILY     Gastroenterology: Proton Pump Inhibitors Passed - 11/05/2021  3:39 PM      Passed - Valid encounter within last 12 months    Recent Outpatient Visits          3 weeks ago Essential hypertension   [facility] [place], [place], MD   2 months ago Essential hypertension   [facility] [place], Kenner, MD   3 months ago Hypothyroidism due to Hashimoto's thyroiditis   [facility], [place]   6 months ago Hypothyroidism due to Hashimoto's thyroiditis   [facility], [facility], MD   8 months ago Cough   [facility] [place], [place], Alessandra Bevels      Future Appointments            In 4 months Bacigalupo, [place], MD [facility], [facility]

## 2021-11-18 ENCOUNTER — Other Ambulatory Visit: Payer: Self-pay | Admitting: Family Medicine

## 2021-11-18 DIAGNOSIS — M62838 Other muscle spasm: Secondary | ICD-10-CM

## 2021-11-18 NOTE — Telephone Encounter (Signed)
Requested medication (s) are due for refill today: yes  Requested medication (s) are on the active medication list: yes  Last refill:  10/22/21  Future visit scheduled: 03/18/2022  Notes to clinic:  This medication can not be delegated, please assess.    Requested Prescriptions  Pending Prescriptions Disp Refills   baclofen (LIORESAL) 10 MG tablet [Pharmacy Med Name: BACLOFEN TABS 10MG ] 90 tablet 11    Sig: TAKE 1 TABLET THREE TIMES A DAY     Not Delegated - Analgesics:  Muscle Relaxants Failed - 11/18/2021  3:50 PM      Failed - This refill cannot be delegated      Passed - Valid encounter within last 6 months    Recent Outpatient Visits           1 month ago Essential hypertension   [facility] [place], [place], MD   3 months ago Essential hypertension   [facility] [place], [place], MD   4 months ago Hypothyroidism due to Hashimoto's thyroiditis   [facility], [facility], MD   7 months ago Hypothyroidism due to Hashimoto's thyroiditis   [facility], [facility], MD   8 months ago Cough   [facility] [place], [place], Alessandra Bevels       Future Appointments             In 4 months Bacigalupo, [place], MD [facility], [facility]

## 2021-11-19 NOTE — Telephone Encounter (Signed)
LOV: 10/14/2021   Thanks,   -Vernona Rieger

## 2021-11-19 NOTE — Telephone Encounter (Signed)
Needs appt. Given refill for 2 weeks but needs to discuss how she is taking

## 2021-12-22 ENCOUNTER — Ambulatory Visit: Payer: BC Managed Care – PPO | Admitting: Physician Assistant

## 2021-12-30 ENCOUNTER — Other Ambulatory Visit: Payer: Self-pay | Admitting: Family Medicine

## 2021-12-30 NOTE — Telephone Encounter (Signed)
Requested Prescriptions  Pending Prescriptions Disp Refills   hydrochlorothiazide (HYDRODIURIL) 25 MG tablet [Pharmacy Med Name: HYDROCHLOROTHIAZIDE TABS 25MG ] 90 tablet 0    Sig: TAKE 1 TABLET DAILY     Cardiovascular: Diuretics - Thiazide Passed - 12/30/2021 12:24 AM      Passed - Cr in normal range and within 180 days    Creatinine, Ser  Date Value Ref Range Status  08/12/2021 0.92 0.57 - 1.00 mg/dL Final         Passed - K in normal range and within 180 days    Potassium  Date Value Ref Range Status  08/12/2021 4.0 3.5 - 5.2 mmol/L Final         Passed - Na in normal range and within 180 days    Sodium  Date Value Ref Range Status  08/12/2021 139 134 - 144 mmol/L Final         Passed - Last BP in normal range    BP Readings from Last 1 Encounters:  10/14/21 122/88         Passed - Valid encounter within last 6 months    Recent Outpatient Visits          2 months ago Essential hypertension   [facility] [place], [place], MD   4 months ago Essential hypertension   [facility] [place], [place], MD   5 months ago Hypothyroidism due to Hashimoto's thyroiditis   [facility], [facility], MD   8 months ago Hypothyroidism due to Hashimoto's thyroiditis   [facility], [facility], MD   10 months ago Cough   [facility] [place], [place], Alessandra Bevels      Future Appointments            In 2 months Bacigalupo, [place], MD [facility], [facility]

## 2022-01-18 ENCOUNTER — Ambulatory Visit: Payer: BC Managed Care – PPO | Admitting: Family Medicine

## 2022-02-04 ENCOUNTER — Other Ambulatory Visit: Payer: Self-pay | Admitting: Physician Assistant

## 2022-02-04 DIAGNOSIS — M62838 Other muscle spasm: Secondary | ICD-10-CM

## 2022-02-04 NOTE — Telephone Encounter (Signed)
Completed course , ordered 11/19/21-12/03/21 ?Requested Prescriptions  ?Refused Prescriptions Disp Refills  ?? baclofen (LIORESAL) 10 MG tablet [Pharmacy Med Name: BACLOFEN TABS 10MG] 42 tablet 25  ?  Sig: TAKE 1 TABLET THREE TIMES A DAY FOR 14 DAYS (NEED APPOINTMENT TO ADDRESS USE OF BACLOFEN)  ?  ? Analgesics:  Muscle Relaxants - baclofen Passed - 02/04/2022  8:17 AM  ?  ?  Passed - Cr in normal range and within 180 days  ?  Creatinine, Ser  ?Date Value Ref Range Status  ?08/12/2021 0.92 0.57 - 1.00 mg/dL Final  ?   ?  ?  Passed - eGFR is 30 or above and within 180 days  ?  GFR calc Af Amer  ?Date Value Ref Range Status  ?09/25/2018 77 >59 mL/min/1.73 Final  ? ?GFR calc non Af Amer  ?Date Value Ref Range Status  ?09/25/2018 66 >59 mL/min/1.73 Final  ? ?eGFR  ?Date Value Ref Range Status  ?08/12/2021 69 >59 mL/min/1.73 Final  ?   ?  ?  Passed - Valid encounter within last 6 months  ?  Recent Outpatient Visits   ?      ? 3 months ago Essential hypertension  ? [facility] [place], Dionne Bucy, MD  ? 5 months ago Essential hypertension  ? [facility] [place], Dionne Bucy, MD  ? 6 months ago Hypothyroidism due to Hashimoto's thyroiditis  ? [facility] [place], Dionne Bucy, MD  ? 9 months ago Hypothyroidism due to Hashimoto's thyroiditis  ? [facility] [place], Dionne Bucy, MD  ? 11 months ago Cough  ? [place], [place]  ?  ?  ?Future Appointments   ?        ? In 1 month Bacigalupo, Dionne Bucy, MD [facility], [facility]  ?  ? ?  ?  ?  ? ?

## 2022-03-14 ENCOUNTER — Encounter: Payer: Self-pay | Admitting: Family Medicine

## 2022-03-14 DIAGNOSIS — E7800 Pure hypercholesterolemia, unspecified: Secondary | ICD-10-CM

## 2022-03-14 DIAGNOSIS — I10 Essential (primary) hypertension: Secondary | ICD-10-CM

## 2022-03-14 DIAGNOSIS — E559 Vitamin D deficiency, unspecified: Secondary | ICD-10-CM

## 2022-03-14 DIAGNOSIS — E063 Autoimmune thyroiditis: Secondary | ICD-10-CM

## 2022-03-14 NOTE — Telephone Encounter (Signed)
Ok to go ahead and order CMP, lipid, A1c, TSH, Vit D for her to get fasting at least 2 days before CPE appt. ?

## 2022-03-16 DIAGNOSIS — E063 Autoimmune thyroiditis: Secondary | ICD-10-CM | POA: Diagnosis not present

## 2022-03-16 DIAGNOSIS — E7800 Pure hypercholesterolemia, unspecified: Secondary | ICD-10-CM | POA: Diagnosis not present

## 2022-03-16 DIAGNOSIS — I10 Essential (primary) hypertension: Secondary | ICD-10-CM | POA: Diagnosis not present

## 2022-03-16 DIAGNOSIS — Z6835 Body mass index (BMI) 35.0-35.9, adult: Secondary | ICD-10-CM | POA: Diagnosis not present

## 2022-03-16 DIAGNOSIS — E559 Vitamin D deficiency, unspecified: Secondary | ICD-10-CM | POA: Diagnosis not present

## 2022-03-16 DIAGNOSIS — E038 Other specified hypothyroidism: Secondary | ICD-10-CM | POA: Diagnosis not present

## 2022-03-17 LAB — LIPID PANEL WITH LDL/HDL RATIO
Cholesterol, Total: 173 mg/dL (ref 100–199)
HDL: 52 mg/dL (ref >39)
LDL Chol Calc (NIH): 95 mg/dL (ref 0–99)
LDL/HDL Ratio: 1.8 ratio (ref 0.0–3.2)
Triglycerides: 151 mg/dL — ABNORMAL HIGH (ref 0–149)
VLDL Cholesterol Cal: 26 mg/dL (ref 5–40)

## 2022-03-17 LAB — COMPREHENSIVE METABOLIC PANEL
ALT: 31 IU/L (ref 0–32)
AST: 29 IU/L (ref 0–40)
Albumin/Globulin Ratio: 1.9 (ref 1.2–2.2)
Albumin: 4.9 g/dL — ABNORMAL HIGH (ref 3.8–4.8)
Alkaline Phosphatase: 73 IU/L (ref 44–121)
BUN/Creatinine Ratio: 18 (ref 12–28)
BUN: 16 mg/dL (ref 8–27)
Bilirubin Total: 0.4 mg/dL (ref 0.0–1.2)
CO2: 27 mmol/L (ref 20–29)
Calcium: 10 mg/dL (ref 8.7–10.3)
Chloride: 97 mmol/L (ref 96–106)
Creatinine, Ser: 0.89 mg/dL (ref 0.57–1.00)
Globulin, Total: 2.6 g/dL (ref 1.5–4.5)
Glucose: 115 mg/dL — ABNORMAL HIGH (ref 70–99)
Potassium: 3.9 mmol/L (ref 3.5–5.2)
Sodium: 141 mmol/L (ref 134–144)
Total Protein: 7.5 g/dL (ref 6.0–8.5)
eGFR: 71 mL/min/{1.73_m2} (ref >59)

## 2022-03-17 LAB — HEMOGLOBIN A1C
Est. average glucose Bld gHb Est-mCnc: 126 mg/dL
Hgb A1c MFr Bld: 6 % — ABNORMAL HIGH (ref 4.8–5.6)

## 2022-03-17 LAB — VITAMIN D 25 HYDROXY (VIT D DEFICIENCY, FRACTURES): Vit D, 25-Hydroxy: 31.2 ng/mL (ref 30.0–100.0)

## 2022-03-17 LAB — TSH: TSH: 3.36 u[IU]/mL (ref 0.450–4.500)

## 2022-03-18 ENCOUNTER — Ambulatory Visit (INDEPENDENT_AMBULATORY_CARE_PROVIDER_SITE_OTHER): Payer: BC Managed Care – PPO | Admitting: Family Medicine

## 2022-03-18 ENCOUNTER — Encounter: Payer: Self-pay | Admitting: Family Medicine

## 2022-03-18 VITALS — BP 124/84 | HR 78 | Temp 98.2°F | Ht 66.0 in | Wt 222.9 lb

## 2022-03-18 DIAGNOSIS — Z0000 Encounter for general adult medical examination without abnormal findings: Secondary | ICD-10-CM

## 2022-03-18 DIAGNOSIS — E7800 Pure hypercholesterolemia, unspecified: Secondary | ICD-10-CM

## 2022-03-18 DIAGNOSIS — M353 Polymyalgia rheumatica: Secondary | ICD-10-CM

## 2022-03-18 DIAGNOSIS — E038 Other specified hypothyroidism: Secondary | ICD-10-CM

## 2022-03-18 DIAGNOSIS — I10 Essential (primary) hypertension: Secondary | ICD-10-CM | POA: Diagnosis not present

## 2022-03-18 DIAGNOSIS — Z780 Asymptomatic menopausal state: Secondary | ICD-10-CM

## 2022-03-18 DIAGNOSIS — E063 Autoimmune thyroiditis: Secondary | ICD-10-CM

## 2022-03-18 DIAGNOSIS — Z6835 Body mass index (BMI) 35.0-35.9, adult: Secondary | ICD-10-CM

## 2022-03-18 NOTE — Assessment & Plan Note (Signed)
Discussed importance of healthy weight management Discussed diet and exercise  

## 2022-03-18 NOTE — Assessment & Plan Note (Signed)
Well controlled Continue current medications Reviewed recent metabolic panel F/u in 6 months  

## 2022-03-18 NOTE — Assessment & Plan Note (Signed)
TSH wnl - reviewed with patient ?Continue synthroid at current dose ?

## 2022-03-18 NOTE — Assessment & Plan Note (Signed)
F/b rheumatology ?No longer on prednisone ?

## 2022-03-18 NOTE — Assessment & Plan Note (Signed)
Well controlled ?Continue statin ?Reviewed FLP and CMP ?

## 2022-03-18 NOTE — Progress Notes (Signed)
? ? ?I,Sha'taria Tyson,acting as a scribe for Shirlee LatchAngela Miciah Covelli, MD.,have documented all relevant documentation on the behalf of Shirlee LatchAngela Zendaya Groseclose, MD,as directed by  Shirlee LatchAngela Youlanda Tomassetti, MD while in the presence of Shirlee LatchAngela Gracious Renken, MD. ? ?Complete physical exam ? ? ?Patient: Alexis Newman   DOB: 1955-12-21   66 y.o. Female  MRN: [id number] ?Visit Date: 03/18/2022 ? ?Today's healthcare provider: Shirlee LatchAngela Iniko Robles, MD  ? ?Chief Complaint  ?Patient presents with  ? Annual Exam  ? ?Subjective  ?  ?Alexis Newman is a 66 y.o. female who presents today for a complete physical exam.  ?She reports consuming a general diet. The patient does not participate in regular exercise at present. She generally feels well. She reports sleeping well. She does not have additional problems to discuss today.  ?HPI  ?-Pneumonia vaccine says she received already will check with pharmacy. No indication in [facility] ?-Tetanus vaccine says she will check with pharmacy believes she received already. No indication in [facility] ?-Dexa Scan wants doctor opinion, does not feel as if she has any factors ? ?Past Medical History:  ?Diagnosis Date  ? Allergy   ? Arthritis   ? Chickenpox   ? GERD (gastroesophageal reflux disease)   ? Hypothyroidism   ? Mononucleosis   ? Pseudogout   ? Sleep apnea   ? Thyroid disease   ? Varicella without complication 04/08/2015  ? ?Past Surgical History:  ?Procedure Laterality Date  ? ABDOMINAL HYSTERECTOMY  12/2002  ? Still has cervix. Due to endometriosis  ? ESOPHAGOGASTRODUODENOSCOPY (EGD) WITH PROPOFOL N/A 07/31/2015  ? Procedure: ESOPHAGOGASTRODUODENOSCOPY (EGD) WITH PROPOFOL;  Surgeon: Wallace CullensPaul Y Oh, MD;  [facility];  Service: Gastroenterology;  Laterality: N/A;  ? ILIOTIBIAL BAND RELEASE  1983  ? Iliotibial extraaticcular reconstruction   ? JOINT REPLACEMENT    ? KNEE SURGERY Bilateral   ? Multiple surgies bilaterally.   ? OSTEOTOMY  1992  ? Partial osteotomy  ? OVARIAN CYST SURGERY  1978 & 1983  ? REPLACEMENT TOTAL  KNEE Left 2004  ? ?Social History  ? ?Socioeconomic History  ? Marital status: Married  ?  Spouse name: Pieter PartridgeMiriam  ? Number of children: 0  ? Years of education: Ph.D.  ? Highest education level: Not on file  ?Occupational History  ? Occupation: Professor  ?  Employer: [employer]  ?Tobacco Use  ? Smoking status: Never  ? Smokeless tobacco: Never  ?Substance and Sexual Activity  ? Alcohol use: Yes  ?  Alcohol/week: 2.0 standard drinks  ?  Types: 2 Cans of beer per week  ?  Comment: Occasionally  ? Drug use: No  ? Sexual activity: Not on file  ?Other Topics Concern  ? Not on file  ?Social History Narrative  ? Not on file  ? ?Social Determinants of Health  ? ?Financial Resource Strain: Not on file  ?Food Insecurity: Not on file  ?Transportation Needs: Not on file  ?Physical Activity: Not on file  ?Stress: Not on file  ?Social Connections: Not on file  ?Intimate Partner Violence: Not on file  ? ?Family Status  ?Relation Name Status  ? Mother  Alive  ? Father  Deceased at age 67  ? Sister  Alive  ? Brother  Alive  ? MGF  Deceased  ? Neg Hx  (Not Specified)  ? ?Family History  ?Problem Relation Age of Onset  ? Diabetes Mother   ? Glaucoma Mother   ? Stroke Father   ? Hypertension Father   ?  Skin cancer Father   ? Dementia Father   ? Healthy Sister   ? Hypertension Brother   ? Diabetes Brother   ? Stroke Maternal Grandfather   ? Breast cancer Neg Hx   ? ?No Known Allergies  ?Patient Care Team: ?Erasmo Downer, MD as PCP - General (Family Medicine)  ? ?Medications: ?Outpatient Medications Prior to Visit  ?Medication Sig  ? acetaminophen (TYLENOL) 500 MG tablet Take 1 tablet (500 mg total) by mouth every 6 (six) hours as needed.  ? cholecalciferol (VITAMIN D) 1000 units tablet Take 1 tablet (1,000 Units total) by mouth daily.  ? hydrochlorothiazide (HYDRODIURIL) 25 MG tablet TAKE 1 TABLET DAILY  ? levothyroxine (SYNTHROID) 100 MCG tablet TAKE 1 TABLET BY MOUTH EVERY DAY  ? omeprazole (PRILOSEC) 40 MG capsule TAKE 1  CAPSULE DAILY  ? rosuvastatin (CRESTOR) 5 MG tablet Take 1 tablet (5 mg total) by mouth daily.  ? Calcium Carb-Cholecalciferol (CALCIUM 600 + D PO) Take by mouth.  ? ?No facility-administered medications prior to visit.  ? ? ?Review of Systems  ?All other systems reviewed and are negative. ? ? ? Objective  ? ?  ?BP 124/84 (BP Location: Left Arm, Patient Position: Sitting, Cuff Size: Normal)   Pulse 78   Temp 98.2 ?F (36.8 ?C) (Oral)   Ht 5\' 6"  (1.676 m)   Wt 222 lb 14.4 oz (101.1 kg)   BMI 35.98 kg/m?  ?BP Readings from Last 3 Encounters:  ?03/18/22 124/84  ?10/14/21 122/88  ?08/12/21 (!) 158/100  ? ?Wt Readings from Last 3 Encounters:  ?03/18/22 222 lb 14.4 oz (101.1 kg)  ?10/14/21 220 lb 8 oz (100 kg)  ?08/12/21 223 lb 6.4 oz (101.3 kg)  ? ?  ?Physical Exam ?Vitals reviewed.  ?Constitutional:   ?   General: She is not in acute distress. ?   Appearance: Normal appearance. She is well-developed. She is not diaphoretic.  ?HENT:  ?   Head: Normocephalic and atraumatic.  ?   Right Ear: Tympanic membrane, ear canal and external ear normal.  ?   Left Ear: Tympanic membrane, ear canal and external ear normal.  ?   Nose: Nose normal.  ?   Mouth/Throat:  ?   Mouth: Mucous membranes are moist.  ?   Pharynx: Oropharynx is clear. No oropharyngeal exudate.  ?Eyes:  ?   General: No scleral icterus. ?   Conjunctiva/sclera: Conjunctivae normal.  ?   Pupils: Pupils are equal, round, and reactive to light.  ?Neck:  ?   Thyroid: No thyromegaly.  ?Cardiovascular:  ?   Rate and Rhythm: Normal rate and regular rhythm.  ?   Pulses: Normal pulses.  ?   Heart sounds: Normal heart sounds. No murmur heard. ?Pulmonary:  ?   Effort: Pulmonary effort is normal. No respiratory distress.  ?   Breath sounds: Normal breath sounds. No wheezing or rales.  ?Abdominal:  ?   General: There is no distension.  ?   Palpations: Abdomen is soft.  ?   Tenderness: There is no abdominal tenderness.  ?Musculoskeletal:     ?   General: No deformity.  ?    Cervical back: Neck supple.  ?   Right lower leg: No edema.  ?   Left lower leg: No edema.  ?Lymphadenopathy:  ?   Cervical: No cervical adenopathy.  ?Skin: ?   General: Skin is warm and dry.  ?   Findings: No rash.  ?Neurological:  ?   Mental Status:  She is alert and oriented to person, place, and time. Mental status is at baseline.  ?   Gait: Gait normal.  ?Psychiatric:     ?   Mood and Affect: Mood normal.     ?   Behavior: Behavior normal.     ?   Thought Content: Thought content normal.  ?  ? ? ?Last depression screening scores ? ?  10/14/2021  ?  3:50 PM 09/30/2020  ?  7:34 AM 11/16/2017  ?  3:53 PM  ?PHQ 2/9 Scores  ?PHQ - 2 Score 0 0 0  ?PHQ- 9 Score 0    ? ?Last fall risk screening ? ?  10/14/2021  ?  3:50 PM  ?Fall Risk   ?Falls in the past year? 0  ?Number falls in past yr: 0  ?Injury with Fall? 0  ?Risk for fall due to : No Fall Risks  ?Follow up Falls evaluation completed  ? ?Last Audit-C alcohol use screening ? ?  10/14/2021  ?  3:50 PM  ?Alcohol Use Disorder Test (AUDIT)  ?1. How often do you have a drink containing alcohol? 2  ?2. How many drinks containing alcohol do you have on a typical day when you are drinking? 0  ?3. How often do you have six or more drinks on one occasion? 0  ?AUDIT-C Score 2  ? ?A score of 3 or more in women, and 4 or more in men indicates increased risk for alcohol abuse, EXCEPT if all of the points are from question 1  ? ?No results found for any visits on 03/18/22. ? Assessment & Plan  ?  ?Routine Health Maintenance and Physical Exam ? ?Exercise Activities and Dietary recommendations ? Goals   ?None ?  ? ? ?Immunization History  ?Administered Date(s) Administered  ? Hepatitis A 09/13/2004  ? IPV 09/13/2004  ? Influenza Split 11/02/2011, 12/05/2012  ? Influenza,inj,Quad PF,6+ Mos 08/12/2019  ? Influenza-Unspecified 09/01/2017, 09/07/2018, 08/06/2021  ? PFIZER Comirnaty(Gray Top)Covid-19 Tri-Sucrose Vaccine 04/28/2021  ? PFIZER(Purple Top)SARS-COV-2 Vaccination 01/31/2020,  02/27/2020, 07/15/2020  ? 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 Td 09/13/2004  ? Tdap 11/02/2011  ? Typhoid Live 09/13/2004  ? Zoster Recombinat (Shingrix) 08/13/2018, 10/11/2018  ?

## 2022-04-04 ENCOUNTER — Other Ambulatory Visit: Payer: Self-pay | Admitting: Family Medicine

## 2022-04-04 DIAGNOSIS — E038 Other specified hypothyroidism: Secondary | ICD-10-CM

## 2022-04-05 NOTE — Telephone Encounter (Signed)
Requested Prescriptions  ?Pending Prescriptions Disp Refills  ?? levothyroxine (SYNTHROID) 100 MCG tablet [Pharmacy Med Name: LEVOTHYROXINE 100 MCG TABLET] 90 tablet 1  ?  Sig: TAKE 1 TABLET BY MOUTH EVERY DAY  ?  ? Endocrinology:  Hypothyroid Agents Passed - 04/04/2022  2:50 AM  ?  ?  Passed - TSH in normal range and within 360 days  ?  TSH  ?Date Value Ref Range Status  ?03/16/2022 3.360 0.450 - 4.500 uIU/mL Final  ?   ?  ?  Passed - Valid encounter within last 12 months  ?  Recent Outpatient Visits   ?      ? 2 weeks ago Encounter for annual physical exam  ? [facility] [place], Marzella Schlein, MD  ? 5 months ago Essential hypertension  ? [facility], Marzella Schlein, MD  ? 7 months ago Essential hypertension  ? [facility] [place], Marzella Schlein, MD  ? 8 months ago Hypothyroidism due to Hashimoto's thyroiditis  ? [facility] [place], Marzella Schlein, MD  ? 11 months ago Hypothyroidism due to Hashimoto's thyroiditis  ? [facility] [place], Marzella Schlein, MD  ?  ?  ?Future Appointments   ?        ? In 6 months [place], Marzella Schlein, MD [facility]  ?  ? ?  ?  ?  ? ? ?

## 2022-04-07 ENCOUNTER — Encounter: Payer: Self-pay | Admitting: Family Medicine

## 2022-04-07 DIAGNOSIS — E063 Autoimmune thyroiditis: Secondary | ICD-10-CM

## 2022-04-07 MED ORDER — LEVOTHYROXINE SODIUM 100 MCG PO TABS: 100.0000 ug | ORAL_TABLET | Freq: Every day | ORAL | 1 refills | Status: DC

## 2022-04-18 ENCOUNTER — Other Ambulatory Visit: Payer: Self-pay | Admitting: Family Medicine

## 2022-05-04 ENCOUNTER — Other Ambulatory Visit: Payer: Self-pay | Admitting: Family Medicine

## 2022-05-04 DIAGNOSIS — K219 Gastro-esophageal reflux disease without esophagitis: Secondary | ICD-10-CM

## 2022-05-04 NOTE — Telephone Encounter (Signed)
Requested Prescriptions  Pending Prescriptions Disp Refills  . omeprazole (PRILOSEC) 40 MG capsule [Pharmacy Med Name: OMEPRAZOLE DR CAPS 40MG ] 90 capsule 3    Sig: TAKE 1 CAPSULE DAILY     Gastroenterology: Proton Pump Inhibitors Passed - 05/04/2022 12:16 AM      Passed - Valid encounter within last 12 months    Recent Outpatient Visits          1 month ago Encounter for annual physical exam   [facility] [place], Kenner, MD   6 months ago Essential hypertension   [facility] [place], [place], MD   8 months ago Essential hypertension   [facility] [place], [place], MD   9 months ago Hypothyroidism due to Hashimoto's thyroiditis   [facility], [facility], MD   1 year ago Hypothyroidism due to Hashimoto's thyroiditis   [facility], [facility], MD      Future Appointments            In 5 months Bac[place], Marzella Schlein, MD [facility]

## 2022-05-09 DIAGNOSIS — I10 Essential (primary) hypertension: Secondary | ICD-10-CM | POA: Diagnosis not present

## 2022-05-09 DIAGNOSIS — Z0000 Encounter for general adult medical examination without abnormal findings: Secondary | ICD-10-CM | POA: Diagnosis not present

## 2022-05-09 DIAGNOSIS — E069 Thyroiditis, unspecified: Secondary | ICD-10-CM | POA: Diagnosis not present

## 2022-05-09 DIAGNOSIS — Z1211 Encounter for screening for malignant neoplasm of colon: Secondary | ICD-10-CM | POA: Diagnosis not present

## 2022-05-09 DIAGNOSIS — Z780 Asymptomatic menopausal state: Secondary | ICD-10-CM | POA: Diagnosis not present

## 2022-05-16 DIAGNOSIS — Z780 Asymptomatic menopausal state: Secondary | ICD-10-CM | POA: Diagnosis not present

## 2022-06-01 DIAGNOSIS — M1611 Unilateral primary osteoarthritis, right hip: Secondary | ICD-10-CM | POA: Diagnosis not present

## 2022-08-05 ENCOUNTER — Other Ambulatory Visit: Payer: Self-pay | Admitting: Internal Medicine

## 2022-08-05 DIAGNOSIS — Z1231 Encounter for screening mammogram for malignant neoplasm of breast: Secondary | ICD-10-CM

## 2022-09-03 DIAGNOSIS — U071 COVID-19: Secondary | ICD-10-CM | POA: Diagnosis not present

## 2022-09-14 DIAGNOSIS — J0100 Acute maxillary sinusitis, unspecified: Secondary | ICD-10-CM | POA: Diagnosis not present

## 2022-09-14 DIAGNOSIS — U071 COVID-19: Secondary | ICD-10-CM | POA: Diagnosis not present

## 2022-09-15 ENCOUNTER — Ambulatory Visit
Admission: RE | Admit: 2022-09-15 | Discharge: 2022-09-15 | Disposition: A | Discharge status: Home / Self Care | Payer: BC Managed Care – PPO | Source: Ambulatory Visit | Attending: Internal Medicine | Admitting: Internal Medicine

## 2022-09-15 DIAGNOSIS — Z1231 Encounter for screening mammogram for malignant neoplasm of breast: Secondary | ICD-10-CM | POA: Insufficient documentation

## 2022-09-19 ENCOUNTER — Other Ambulatory Visit: Payer: Self-pay | Admitting: Family Medicine

## 2022-09-19 DIAGNOSIS — E7800 Pure hypercholesterolemia, unspecified: Secondary | ICD-10-CM

## 2022-10-03 ENCOUNTER — Ambulatory Visit: Payer: BC Managed Care – PPO | Admitting: Family Medicine

## 2022-10-17 ENCOUNTER — Other Ambulatory Visit: Payer: Self-pay | Admitting: Family Medicine

## 2022-10-31 ENCOUNTER — Other Ambulatory Visit: Payer: Self-pay | Admitting: Family Medicine

## 2022-10-31 DIAGNOSIS — K219 Gastro-esophageal reflux disease without esophagitis: Secondary | ICD-10-CM

## 2022-11-08 DIAGNOSIS — Z780 Asymptomatic menopausal state: Secondary | ICD-10-CM | POA: Diagnosis not present

## 2022-11-08 DIAGNOSIS — E7800 Pure hypercholesterolemia, unspecified: Secondary | ICD-10-CM | POA: Diagnosis not present

## 2022-11-08 DIAGNOSIS — I10 Essential (primary) hypertension: Secondary | ICD-10-CM | POA: Diagnosis not present

## 2022-12-09 ENCOUNTER — Other Ambulatory Visit: Payer: Self-pay | Admitting: Family Medicine

## 2022-12-09 DIAGNOSIS — E038 Other specified hypothyroidism: Secondary | ICD-10-CM

## 2022-12-09 NOTE — Telephone Encounter (Signed)
Requested Prescriptions  Pending Prescriptions Disp Refills   levothyroxine (SYNTHROID) 100 MCG tablet [Pharmacy Med Name: LEVOTHYROXINE 100 MCG TABLET] 90 tablet 0    Sig: TAKE 1 TABLET BY MOUTH EVERY DAY     Endocrinology:  Hypothyroid Agents Passed - 12/09/2022  1:55 AM      Passed - TSH in normal range and within 360 days    TSH  Date Value Ref Range Status  03/16/2022 3.360 0.450 - 4.500 uIU/mL Final         Passed - Valid encounter within last 12 months    Recent Outpatient Visits           8 months ago Encounter for annual physical exam   [facility] [place], Dionne Bucy, MD   1 year ago Essential hypertension   [place], Dionne Bucy, MD   1 year ago Essential hypertension   [place], Dionne Bucy, MD   1 year ago Hypothyroidism due to [place], Dionne Bucy, MD   1 year ago Hypothyroidism due to Hashimoto's thyroiditis   [facility], Dionne Bucy, MD

## 2022-12-19 ENCOUNTER — Other Ambulatory Visit: Payer: Self-pay | Admitting: Family Medicine

## 2022-12-19 DIAGNOSIS — E7800 Pure hypercholesterolemia, unspecified: Secondary | ICD-10-CM

## 2023-03-13 ENCOUNTER — Other Ambulatory Visit: Payer: Self-pay | Admitting: Family Medicine

## 2023-03-13 DIAGNOSIS — E7800 Pure hypercholesterolemia, unspecified: Secondary | ICD-10-CM

## 2023-03-17 ENCOUNTER — Other Ambulatory Visit: Payer: Self-pay | Admitting: Family Medicine

## 2023-03-17 DIAGNOSIS — E038 Other specified hypothyroidism: Secondary | ICD-10-CM

## 2023-05-22 ENCOUNTER — Other Ambulatory Visit: Payer: Self-pay | Admitting: Family Medicine

## 2023-05-22 DIAGNOSIS — E038 Other specified hypothyroidism: Secondary | ICD-10-CM

## 2023-05-23 NOTE — Telephone Encounter (Signed)
Requested medication (s) are due for refill today: no  Requested medication (s) are on the active medication list: yes  Last refill:  03/17/23  Future visit scheduled: no  Notes to clinic:  Unable to refill per protocol, courtesy refill already given, routing for provider approval.      Requested Prescriptions  Pending Prescriptions Disp Refills   levothyroxine (SYNTHROID) 100 MCG tablet [Pharmacy Med Name: L-THYROXINE TABS 100MCG] 90 tablet 3    Sig: TAKE 1 TABLET DAILY     Endocrinology:  Hypothyroid Agents Failed - 05/22/2023  1:03 AM      Failed - TSH in normal range and within 360 days    TSH  Date Value Ref Range Status  03/16/2022 3.360 0.450 - 4.500 uIU/mL Final         Failed - Valid encounter within last 12 months    Recent Outpatient Visits           1 year ago Encounter for annual physical exam   [facility] [place], Marzella Schlein, MD   1 year ago Essential hypertension   [facility], Marzella Schlein, MD   1 year ago Essential hypertension   [place] [facility] [place], Marzella Schlein, MD   1 year ago Hypothyroidism due to Hashimoto's thyroiditis   [facility] Erasmo Downer, MD   2 years ago Hypothyroidism due to Hashimoto's thyroiditis   [facility] [place], Marzella Schlein, MD

## 2023-09-12 ENCOUNTER — Other Ambulatory Visit: Payer: Self-pay | Admitting: Physician Assistant

## 2023-09-12 DIAGNOSIS — Z1231 Encounter for screening mammogram for malignant neoplasm of breast: Secondary | ICD-10-CM

## 2023-09-18 ENCOUNTER — Other Ambulatory Visit: Payer: Self-pay | Admitting: Family Medicine

## 2023-09-18 DIAGNOSIS — E7800 Pure hypercholesterolemia, unspecified: Secondary | ICD-10-CM

## 2023-09-19 ENCOUNTER — Ambulatory Visit
Admission: RE | Admit: 2023-09-19 | Discharge: 2023-09-19 | Disposition: A | Discharge status: Home / Self Care | Payer: BC Managed Care – PPO | Source: Ambulatory Visit | Attending: Physician Assistant | Admitting: Physician Assistant

## 2023-09-19 DIAGNOSIS — Z1231 Encounter for screening mammogram for malignant neoplasm of breast: Secondary | ICD-10-CM

## 2023-09-19 NOTE — Telephone Encounter (Signed)
Requested Prescriptions  Refused Prescriptions Disp Refills   rosuvastatin (CRESTOR) 5 MG tablet [Pharmacy Med Name: ROSUVASTATIN TABS 5MG ] 30 tablet 11    Sig: TAKE 1 TABLET DAILY (PLEASE SCHEDULE OFFICE VISIT BEFORE ANY FUTURE REFILL)     Cardiovascular:  Antilipid - Statins 2 Failed - 09/18/2023  9:50 AM      Failed - Cr in normal range and within 360 days    Creatinine, Ser  Date Value Ref Range Status  03/16/2022 0.89 0.57 - 1.00 mg/dL Final         Failed - Valid encounter within last 12 months    Recent Outpatient Visits           1 year ago Encounter for annual physical exam   [facility] [place], Marzella Schlein, MD   1 year ago Essential hypertension   [facility] [place], Marzella Schlein, MD   2 years ago Essential hypertension    [facility] Er[facility]o Downer, MD   2 years ago Hypothyroidism due to Hashimoto's thyroiditis    [facility]o [facility], MD   2 years ago Hypothyroidism due to Hashimoto's thyroiditis   [facility] Erasmo Downer, MD              Failed - Lipid Panel in normal range within the last 12 months    Cholesterol, Total  Date Value Ref Range Status  03/16/2022 173 100 - 199 mg/dL Final   LDL Chol Calc (NIH)  Date Value Ref Range Status  03/16/2022 95 0 - 99 mg/dL Final   HDL  Date Value Ref Range Status  03/16/2022 52 >39 mg/dL Final   Triglycerides  Date Value Ref Range Status  03/16/2022 151 (H) 0 - 149 mg/dL Final         Passed - Patient is not pregnant

## 2023-10-11 ENCOUNTER — Other Ambulatory Visit: Payer: Self-pay

## 2023-10-11 ENCOUNTER — Encounter: Payer: Self-pay | Admitting: Physician Assistant

## 2023-10-11 ENCOUNTER — Ambulatory Visit: Payer: Self-pay | Admitting: Physician Assistant

## 2023-10-11 VITALS — BP 154/84 | HR 82 | Temp 97.8°F | Ht 66.0 in | Wt 220.0 lb

## 2023-10-11 DIAGNOSIS — R238 Other skin changes: Secondary | ICD-10-CM

## 2023-10-11 DIAGNOSIS — N898 Other specified noninflammatory disorders of vagina: Secondary | ICD-10-CM

## 2023-10-11 DIAGNOSIS — Z8619 Personal history of other infectious and parasitic diseases: Secondary | ICD-10-CM

## 2023-10-11 DIAGNOSIS — B379 Candidiasis, unspecified: Secondary | ICD-10-CM

## 2023-10-11 MED ORDER — HYDROXYZINE HCL 10 MG PO TABS: 10.0000 mg | ORAL_TABLET | Freq: Three times a day (TID) | ORAL | 0 refills | Status: DC | PRN

## 2023-10-11 MED ORDER — FLUCONAZOLE 150 MG PO TABS: ORAL_TABLET | ORAL | 0 refills | Status: DC

## 2023-10-11 NOTE — Progress Notes (Signed)
Therapist, [facility] [address] [place], [place] [address]   Office Visit Note  Patient Name: SAMANTHAJO TARKINGTON Date of Birth [id number]  Medical Record number [id number]  Date of Service: 10/11/2023  Chief Complaint  Patient presents with   Acute Visit    Patient c/o having itchy bumps on her outer labia. Symptoms began about 6 months ago but have been worsening and spreading over time. She has applied organic virgin coconut oil which helps ease the irritation but it has still not gone away. She is not sexually active.      67 y/o F presents to the clinic for c/o vaginal itching x 6 months and now with bumps. Denies painful bumps, but very itchy. Applied coconut oil which does help with itching, but noticed over the past few weeks bumps are spreading. Denies being sexually active for the past 15 yrs. Denies urinary symptoms. Denies urinary symptoms.       Current Medication:  Outpatient Encounter Medications as of 10/11/2023  Medication Sig   acetaminophen (TYLENOL) 500 MG tablet Take 1 tablet (500 mg total) by mouth every 6 (six) hours as needed.   Calcium Carb-Cholecalciferol (CALCIUM 600 + D PO) Take by mouth daily.   fluconazole (DIFLUCAN) 150 MG tablet Take one tablet by mouth x 1 day. May repeat in 72 hours if symptoms don't improve.   hydrochlorothiazide (HYDRODIURIL) 25 MG tablet TAKE 1 TABLET DAILY   hydrOXYzine (ATARAX) 10 MG tablet Take 1 tablet (10 mg total) by mouth 3 (three) times daily as needed.   levothyroxine (SYNTHROID) 100 MCG tablet Take 1 tablet (100 mcg total) by mouth daily. Please schedule office visit before any future refill.   omeprazole (PRILOSEC) 40 MG capsule TAKE 1 CAPSULE DAILY   rosuvastatin (CRESTOR) 5 MG tablet TAKE 1 TABLET DAILY (PLEASE SCHEDULE OFFICE VISIT BEFORE ANY FUTURE REFILL)   [DISCONTINUED] cholecalciferol (VITAMIN D) 1000 units tablet Take 1 tablet (1,000 Units total) by mouth daily.   No facility-administered encounter medications  on file as of 10/11/2023.      Medical History: Past Medical History:  Diagnosis Date   Allergy    Arthritis    Chickenpox    GERD (gastroesophageal reflux disease)    Hypothyroidism    Mononucleosis    Pseudogout    Sleep apnea    Thyroid disease    Varicella without complication 04/08/2015     Vital Signs: BP (!) 154/84   Pulse 82   Temp 97.8 F (36.6 C)   Ht 5\' 6"  (1.676 m)   Wt 220 lb (99.8 kg)   SpO2 98%   BMI 35.51 kg/m    Review of Systems  Constitutional: Negative.   Gastrointestinal: Negative.   Genitourinary:  Positive for genital sores. Negative for difficulty urinating, dysuria, flank pain, frequency, pelvic pain, vaginal discharge and vaginal pain.       Vaginal itching  Musculoskeletal: Negative.     Physical Exam Exam conducted with a chaperone present.  Constitutional:      Appearance: Normal appearance.  HENT:     Head: Normocephalic and atraumatic.     Right Ear: External ear normal.     Left Ear: External ear normal.  Eyes:     Extraocular Movements: Extraocular movements intact.  Genitourinary:      Comments: Today's exam was chaperoned by Selena Batten, MA. R labia - erythema with raw superficial abrasions with scattered few pustular/vesicular lesions L labia- few scattered vesicular/pustular lesions. A single  cystic lesion.  Skin:    General: Skin is warm and dry.  Neurological:     Mental Status: She is alert and oriented to person, place, and time.  Psychiatric:        Mood and Affect: Mood normal.        Behavior: Behavior normal.       Assessment/Plan:  1. Vaginal itching - hydrOXYzine (ATARAX) 10 MG tablet; Take 1 tablet (10 mg total) by mouth 3 (three) times daily as needed.  Dispense: 30 tablet; Refill: 0  2. Yeast infection - fluconazole (DIFLUCAN) 150 MG tablet; Take one tablet by mouth x 1 day. May repeat in 72 hours if symptoms don't improve.  Dispense: 2 tablet; Refill: 0  3. Vesicular skin  lesions - HSV Culture, No Typing  4. History of herpes labialis - HSV Culture, No Typing  I reviewed my clinical findings with patient. Await culture result. Start medicine as prescribed. Apply cool compresses to the affected area Continue to wear cotton underwear Pat dry after showers.  Pt is leaving out of town after the visit and will return few days later. Upon her return will fill her Rxs.  Pt will be contacted for culture result.   General Counseling: tymeisha dumoulin understanding of the findings of todays visit and agrees with plan of treatment. I have discussed any further diagnostic evaluation that may be needed or ordered today. We also reviewed her medications today. she has been encouraged to call the office with any questions or concerns that should arise related to todays visit.    Time spent:30 Minutes    Gilberto Better, [place] Physician Assistant

## 2023-10-12 ENCOUNTER — Other Ambulatory Visit: Payer: Self-pay | Admitting: Physician Assistant

## 2023-10-12 NOTE — Telephone Encounter (Signed)
Requested medications are due for refill today.  yes  Requested medications are on the active medications list.  yes  Last refill. 10/17/2022 #90 3 rf  Future visit scheduled.   no  Notes to clinic.  Pt is more than 3 months overdue for an ov.  Radhika Kalisetti listed as pcp.    Requested Prescriptions  Pending Prescriptions Disp Refills   hydrochlorothiazide (HYDRODIURIL) 25 MG tablet [Pharmacy Med Name: HYDROCHLOROTHIAZIDE TABS 25MG ] 90 tablet 3    Sig: TAKE 1 TABLET DAILY     Cardiovascular: Diuretics - Thiazide Failed - 10/12/2023 12:53 AM      Failed - Cr in normal range and within 180 days    Creatinine, Ser  Date Value Ref Range Status  03/16/2022 0.89 0.57 - 1.00 mg/dL Final         Failed - K in normal range and within 180 days    Potassium  Date Value Ref Range Status  03/16/2022 3.9 3.5 - 5.2 mmol/L Final         Failed - Na in normal range and within 180 days    Sodium  Date Value Ref Range Status  03/16/2022 141 134 - 144 mmol/L Final         Failed - Last BP in normal range    BP Readings from Last 1 Encounters:  10/11/23 (!) 154/84         Failed - Valid encounter within last 6 months    Recent Outpatient Visits           1 year ago Encounter for annual physical exam   [facility] [place], Marzella Schlein, MD   1 year ago Essential hypertension   [facility] [place], Marzella Schlein, MD   2 years ago Essential hypertension   [place] [facility] Erasmo Downer, MD   2 years ago Hypothyroidism due to Hashimoto's thyroiditis   [facility] Erasmo Downer, MD   2 years ago Hypothyroidism due to Hashimoto's thyroiditis   [facility] [place], Marzella Schlein, MD

## 2023-10-13 LAB — HSV CULTURE, NO TYPING: HSV Culture Without Typing: NEGATIVE

## 2023-10-26 ENCOUNTER — Other Ambulatory Visit: Payer: Self-pay | Admitting: Family Medicine

## 2023-10-26 DIAGNOSIS — K219 Gastro-esophageal reflux disease without esophagitis: Secondary | ICD-10-CM

## 2023-10-30 NOTE — Telephone Encounter (Signed)
Requested by interface surescripts. Provider not at this practice.  Requested Prescriptions  Refused Prescriptions Disp Refills   omeprazole (PRILOSEC) 40 MG capsule [Pharmacy Med Name: OMEPRAZOLE DR CAPS 40MG ] 90 capsule 3    Sig: TAKE 1 CAPSULE DAILY     Gastroenterology: Proton Pump Inhibitors Failed - 10/26/2023 12:49 AM      Failed - Valid encounter within last 12 months    Recent Outpatient Visits           1 year ago Encounter for annual physical exam   [facility] [place], Marzella Schlein, MD   2 years ago Essential hypertension   [facility] [place], Marzella Schlein, MD   2 years ago Essential hypertension   [facility]o Down[facility], MD   2 years ago Hypothyroidism due to Hashimoto's thyroiditis   [facility]o [facility], MD   2 years ago Hypothyroidism due to Hashimoto's thyroiditis   [facility] [place], Marzella Schlein, MD

## 2023-12-31 DIAGNOSIS — M1611 Unilateral primary osteoarthritis, right hip: Principal | ICD-10-CM | POA: Insufficient documentation

## 2024-01-17 NOTE — Discharge Instructions (Signed)
Instructions after Total Hip Replacement   Tanvi Gatling P. Angie Fava., M.D.    Dept. of Orthopaedics & Sports Medicine [facility] [address] [place], [place]  [address]  Phone: [phone]   Fax: [phone]        [email or web address]        DIET: Drink plenty of non-alcoholic fluids. Resume your normal diet. Include foods high in fiber.  ACTIVITY:  You may use crutches or a walker with weight-bearing as tolerated, unless instructed otherwise. You may be weaned off of the walker or crutches by your Physical Therapist.  Do NOT reach below the level of your knees or cross your legs until allowed.    Continue doing gentle exercises. Exercising will reduce the pain and swelling, increase motion, and prevent muscle weakness.   Please continue to use the TED compression stockings for 6 weeks. You may remove the stockings at night, but should reapply them in the morning. Do not drive or operate any equipment until instructed.  WOUND CARE:  Continue to use ice packs periodically to reduce pain and swelling. The initial dressing (Aquacel) can remain in place for 7 days (see separate instructions). Keep the incision clean and dry. You may bathe or shower after the staples are removed at the first office visit following surgery.  MEDICATIONS: You may resume your regular medications. Please take the pain medication as prescribed on the medication. Do not take pain medication on an empty stomach. Unless instructed otherwise, you should take an enteric-coated aspirin 81 mg. TWICE a day. (This along with elevation will help reduce the possibility of blood clots/phlebitis in your operated leg.) Use a stool softener (such as Senokot-S or Colace) daily and a laxative (such as Miralax or Dulcolax) as needed to prevent constipation.  Do not drive or drink alcoholic beverages when taking pain medications.  CALL THE OFFICE FOR: Temperature above 101 degrees Excessive bleeding or drainage  on the dressing. Excessive swelling, coldness, or paleness of the toes. Persistent nausea and vomiting.  FOLLOW-UP:  You should have an appointment to return to the office in 6 weeks after surgery. Arrangements have been made for continuation of Physical Therapy (either home therapy or outpatient therapy).     [facility] Department Directory         [email or web address]       [email or web address]          Cardiology  Appointments: [place] - [phone]  Endocrinology  Appointments: [place] [phone] [place] - [phone]  Gastroenterology  Appointments: [place] [phone] [place] - [phone]        General Surgery   Appointments: [facility]  Internal Medicine/Family Medicine  Appointments: [facility] [place] - [phone] [place] - [phone]  Metabolic and Weigh Loss Surgery  Appointments: [facility]        Neurology  Appointments: [place] [phone] [place] - [phone]  Neurosurgery  Appointments: [place]  Obstetrics & Gynecology  Appointments: [place] [phone] [place] - [phone]        Pediatrics  Appointments: Sherrie Sport [phone] [place] - [phone]  Physiatry  Appointments: [place] [phone]  Physical Therapy  Appointments: [place] - [phone]        Podiatry  Appointments: [place] [phone] [place] - [phone]  Pulmonology  Appointments: [place]  Rheumatology  Appointments: [place] [phone]        [place] Location: [facility]  [address] [place], [place]  [address]  [facility] Sport  Location: [facility]. [address] [place], [place]  [address]  [place] Location: [facility] [address] [place], [place]  [address]

## 2024-01-23 ENCOUNTER — Other Ambulatory Visit: Payer: Self-pay

## 2024-01-23 ENCOUNTER — Encounter
Admission: RE | Admit: 2024-01-23 | Discharge: 2024-01-23 | Disposition: A | Discharge status: Home / Self Care | Payer: BC Managed Care – PPO | Source: Ambulatory Visit | Attending: Orthopedic Surgery | Admitting: Orthopedic Surgery

## 2024-01-23 DIAGNOSIS — Z01818 Encounter for other preprocedural examination: Secondary | ICD-10-CM | POA: Diagnosis present

## 2024-01-23 DIAGNOSIS — Z01810 Encounter for preprocedural cardiovascular examination: Secondary | ICD-10-CM | POA: Diagnosis not present

## 2024-01-23 HISTORY — DX: Polymyalgia rheumatica: M35.3

## 2024-01-23 HISTORY — DX: Pure hypercholesterolemia, unspecified: E78.00

## 2024-01-23 HISTORY — DX: Essential (primary) hypertension: I10

## 2024-01-23 LAB — CBC
HCT: 41.1 % (ref 36.0–46.0)
Hemoglobin: 13.7 g/dL (ref 12.0–15.0)
MCH: 28.5 pg (ref 26.0–34.0)
MCHC: 33.3 g/dL (ref 30.0–36.0)
MCV: 85.4 fL (ref 80.0–100.0)
Platelets: 274 10*3/uL (ref 150–400)
RBC: 4.81 MIL/uL (ref 3.87–5.11)
RDW: 13.1 % (ref 11.5–15.5)
WBC: 8.2 10*3/uL (ref 4.0–10.5)
nRBC: 0 % (ref 0.0–0.2)

## 2024-01-23 LAB — URINALYSIS, ROUTINE W REFLEX MICROSCOPIC
Bacteria, UA: NONE SEEN
Bilirubin Urine: NEGATIVE
Glucose, UA: NEGATIVE mg/dL
Ketones, ur: 5 mg/dL — AB
Leukocytes,Ua: NEGATIVE
Nitrite: NEGATIVE
Protein, ur: NEGATIVE mg/dL
Specific Gravity, Urine: 1.027 (ref 1.005–1.030)
pH: 5 (ref 5.0–8.0)

## 2024-01-23 LAB — COMPREHENSIVE METABOLIC PANEL
ALT: 20 U/L (ref 0–44)
AST: 20 U/L (ref 15–41)
Albumin: 3.9 g/dL (ref 3.5–5.0)
Alkaline Phosphatase: 50 U/L (ref 38–126)
Anion gap: 10 (ref 5–15)
BUN: 24 mg/dL — ABNORMAL HIGH (ref 8–23)
CO2: 29 mmol/L (ref 22–32)
Calcium: 10.1 mg/dL (ref 8.9–10.3)
Chloride: 99 mmol/L (ref 98–111)
Creatinine, Ser: 0.77 mg/dL (ref 0.44–1.00)
GFR, Estimated: >60 mL/min (ref >60)
Glucose, Bld: 93 mg/dL (ref 70–99)
Potassium: 3.5 mmol/L (ref 3.5–5.1)
Sodium: 138 mmol/L (ref 135–145)
Total Bilirubin: 0.4 mg/dL (ref 0.0–1.2)
Total Protein: 7.4 g/dL (ref 6.5–8.1)

## 2024-01-23 LAB — SURGICAL PCR SCREEN
MRSA, PCR: NEGATIVE
Staphylococcus aureus: NEGATIVE

## 2024-01-23 LAB — TYPE AND SCREEN
ABO/RH(D): A POS
Antibody Screen: NEGATIVE

## 2024-01-23 LAB — SEDIMENTATION RATE: Sed Rate: 42 mm/h — ABNORMAL HIGH (ref 0–30)

## 2024-01-23 LAB — C-REACTIVE PROTEIN: CRP: 2.3 mg/dL — ABNORMAL HIGH (ref <1.0)

## 2024-01-23 NOTE — Patient Instructions (Addendum)
 Your procedure is scheduled on: Wednesday, March 5 Report to the Registration Desk on the 1st floor of the [facility] To find out your arrival time, please call [phone] between 1PM - 3PM on: Tuesday, March 4 If your arrival time is 6:00 am, do not arrive before that time as the [facility] entrance doors do not open until 6:00 am.  REMEMBER: Instructions that are not followed completely may result in serious medical risk, up to and including death; or upon the discretion of your surgeon and anesthesiologist your surgery may need to be rescheduled.  Do not eat food after midnight the night before surgery.  No gum chewing or hard candies.  You may however, drink CLEAR liquids up to 2 hours before you are scheduled to arrive for your surgery. Do not drink anything within 2 hours of your scheduled arrival time.  Clear liquids include: - water  - apple juice without pulp - gatorade (not RED colors) - black coffee or tea (Do NOT add milk or creamers to the coffee or tea) Do NOT drink anything that is not on this list.  In addition, your doctor has ordered for you to drink the provided:  Ensure Pre-Surgery Clear Carbohydrate Drink  Drinking this carbohydrate drink up to two hours before surgery helps to reduce insulin resistance and improve patient outcomes. Please complete drinking 2 hours before scheduled arrival time.  One week prior to surgery: starting February 26 Stop Anti-inflammatories (NSAIDS) such as Advil, Aleve, Ibuprofen, Motrin, Naproxen, Naprosyn and Aspirin based products such as Excedrin, Goody's Powder, BC Powder. Stop ANY OVER THE COUNTER supplements until after surgery.  You may however, continue to take Tylenol if needed for pain up until the day of surgery.  Semaglutide - hold for 7 days before surgery. Do NOT take on Saturday, March 1. Resume AFTER surgery on your regular weekly day, Saturday, March 8.  Continue taking all of your other prescription  medications up until the day of surgery.  ON THE DAY OF SURGERY ONLY TAKE THESE MEDICATIONS WITH SIPS OF WATER:  Levothyroxine Omeprazole (Prilosec) Rosuvastatin (Crestor)  No Alcohol for 24 hours before or after surgery.  No Smoking including e-cigarettes for 24 hours before surgery.  No chewable tobacco products for at least 6 hours before surgery.  No nicotine patches on the day of surgery.  Do not use any "recreational" drugs for at least a week (preferably 2 weeks) before your surgery.  Please be advised that the combination of cocaine and anesthesia may have negative outcomes, up to and including death. If you test positive for cocaine, your surgery will be cancelled.  On the morning of surgery brush your teeth with toothpaste and water, you may rinse your mouth with mouthwash if you wish. Do not swallow any toothpaste or mouthwash.  Use CHG Soap as directed on instruction sheet.  Do not wear jewelry, make-up, hairpins, clips or nail polish.  For welded (permanent) jewelry: bracelets, anklets, waist bands, etc.  Please have this removed prior to surgery.  If it is not removed, there is a chance that hospital personnel will need to cut it off on the day of surgery.  Do not wear lotions, powders, or perfumes.   Do not shave body hair from the neck down 48 hours before surgery.  Contact lenses, hearing aids and dentures may not be worn into surgery.  Do not bring valuables to the hospital. [facility] is not responsible for any missing/lost belongings or valuables.  Bring your C-PAP to the hospital in case you may have to spend the night.   Notify your doctor if there is any change in your medical condition (cold, fever, infection).  Wear comfortable clothing (specific to your surgery type) to the hospital.  After surgery, you can help prevent lung complications by doing breathing exercises.  Take deep breaths and cough every 1-2 hours. Your doctor may order a device  called an Incentive Spirometer to help you take deep breaths.  If you are being admitted to the hospital overnight, leave your suitcase in the car. After surgery it may be brought to your room.  In case of increased patient census, it may be necessary for you, the patient, to continue your postoperative care in the Same Day Surgery department.  If you are being discharged the day of surgery, you will not be allowed to drive home. You will need a responsible individual to drive you home and stay with you for 24 hours after surgery.   If you are taking public transportation, you will need to have a responsible individual with you.  Please call the Pre-admissions Testing Dept. at [phone] if you have any questions about these instructions.  Surgery Visitation Policy:  Patients having surgery or a procedure may have two visitors.  Children under the age of 39 must have an adult with them who is not the patient.  Temporary Visitor Restrictions Due to increasing cases of flu, RSV and COVID-19: Children ages 28 and under will not be able to visit patients in [facility] hospitals under most circumstances.  Inpatient Visitation:    Visiting hours are 7 a.m. to 8 p.m. Up to four visitors are allowed at one time in a patient room. The visitors may rotate out with other people during the day.  One visitor age 62 or older may stay with the patient overnight and must be in the room by 8 p.m.      Pre-operative 5 CHG Bath Instructions   You can play a key role in reducing the risk of infection after surgery. Your skin needs to be as free of germs as possible. You can reduce the number of germs on your skin by washing with CHG (chlorhexidine gluconate) soap before surgery. CHG is an antiseptic soap that kills germs and continues to kill germs even after washing.   DO NOT use if you have an allergy to chlorhexidine/CHG or antibacterial soaps. If your skin becomes reddened or irritated, stop  using the CHG and notify one of our RNs at [phone].   Please shower with the CHG soap starting 4 days before surgery using the following schedule:     Please keep in mind the following:  DO NOT shave, including legs and underarms, starting the day of your first shower.   You may shave your face at any point before/day of surgery.  Place clean sheets on your bed the day you start using CHG soap. Use a clean washcloth (not used since being washed) for each shower. DO NOT sleep with pets once you start using the CHG.   CHG Shower Instructions:  If you choose to wash your hair and private area, wash first with your normal shampoo/soap.  After you use shampoo/soap, rinse your hair and body thoroughly to remove shampoo/soap residue.  Turn the water OFF and apply about 3 tablespoons (45 ml) of CHG soap to a CLEAN washcloth.  Apply CHG soap ONLY FROM YOUR NECK DOWN TO YOUR TOES (washing  for 3-5 minutes)  DO NOT use CHG soap on face, private areas, open wounds, or sores.  Pay special attention to the area where your surgery is being performed.  If you are having back surgery, having someone wash your back for you may be helpful. Wait 2 minutes after CHG soap is applied, then you may rinse off the CHG soap.  Pat dry with a clean towel  Put on clean clothes/pajamas   If you choose to wear lotion, please use ONLY the CHG-compatible lotions on the back of this paper.     Additional instructions for the day of surgery: DO NOT APPLY any lotions, deodorants, cologne, or perfumes.   Put on clean/comfortable clothes.  Brush your teeth.  Ask your nurse before applying any prescription medications to the skin.      CHG Compatible Lotions   Aveeno Moisturizing lotion  Cetaphil Moisturizing Cream  Cetaphil Moisturizing Lotion  Clairol Herbal Essence Moisturizing Lotion, Dry Skin  Clairol Herbal Essence Moisturizing Lotion, Extra Dry Skin  Clairol Herbal Essence Moisturizing Lotion, Normal  Skin  Curel Age Defying Therapeutic Moisturizing Lotion with Alpha Hydroxy  Curel Extreme Care Body Lotion  Curel Soothing Hands Moisturizing Hand Lotion  Curel Therapeutic Moisturizing Cream, Fragrance-Free  Curel Therapeutic Moisturizing Lotion, Fragrance-Free  Curel Therapeutic Moisturizing Lotion, Original Formula  Eucerin Daily Replenishing Lotion  Eucerin Dry Skin Therapy Plus Alpha Hydroxy Crme  Eucerin Dry Skin Therapy Plus Alpha Hydroxy Lotion  Eucerin Original Crme  Eucerin Original Lotion  Eucerin Plus Crme Eucerin Plus Lotion  Eucerin TriLipid Replenishing Lotion  Keri Anti-Bacterial Hand Lotion  Keri Deep Conditioning Original Lotion Dry Skin Formula Softly Scented  Keri Deep Conditioning Original Lotion, Fragrance Free Sensitive Skin Formula  Keri Lotion Fast Absorbing Fragrance Free Sensitive Skin Formula  Keri Lotion Fast Absorbing Softly Scented Dry Skin Formula  Keri Original Lotion  Keri Skin Renewal Lotion Keri Silky Smooth Lotion  Keri Silky Smooth Sensitive Skin Lotion  Nivea Body Creamy Conditioning Oil  Nivea Body Extra Enriched Lotion  Nivea Body Original Lotion  Nivea Body Sheer Moisturizing Lotion Nivea Crme  Nivea Skin Firming Lotion  NutraDerm 30 Skin Lotion  NutraDerm Skin Lotion  NutraDerm Therapeutic Skin Cream  NutraDerm Therapeutic Skin Lotion  ProShield Protective Hand Cream  Provon moisturizing lotion    Preoperative Educational Videos for Total Hip, Knee and Shoulder Replacements  To better prepare for surgery, please view our videos that explain the physical activity and discharge planning required to have the best surgical recovery at [facility].  [email or web address]  Questions? Call [phone] or email [email or web address]

## 2024-01-24 LAB — HEMOGLOBIN A1C
Hgb A1c MFr Bld: 5.9 % — ABNORMAL HIGH (ref 4.8–5.6)
Mean Plasma Glucose: 123 mg/dL

## 2024-01-28 NOTE — H&P (Signed)
 ORTHOPAEDIC HISTORY & PHYSICAL Raenette Rover, [place] - 01/26/2024 3:15 PM EST Formatting of this note is different from the original. NAME: Alexis Newman H&P Date: 01/26/2024 Procedure Date: 02/02/2024  Chief Complaint: right hip pain  HPI Alexis Newman is a 68 y.o. adult who has severe right hip pain. Patient has a longstanding history of bilateral hip discomfort, with the right bothering more than the left hip. The patient states that the pain is worse with prolonged standing, weightbearing activity or ambulation. It has begun to affect the patient's ability to ambulate long distances and perform her ADLs. Alexis Newman has failed conservative treatment including Tylenol, OTC NSAIDs, activity modification and multiple intra-articular corticosteroid injections into her hip. Alexis Newman is not utilizing any ambulatory aids at this point . Alexis Newman has requested operative intervention for relief of Alexis Newman's DJD symptoms. Patient denies having any history of any cardiac or pulmonary issues. No history of any DVTs or clots. No previous surgeries on this hip. Patient is not a diabetic.  Social Hx: Patient works as a Art gallery manager at [employer], and is looking to retire after the end of this educational year. Lives at home with her wife. Denies any alcohol use, no illicit drug use, no smoking, nicotine or tobacco use.  Medications & Allergies Allergies: No Known Allergies  Home Medicines: Current Outpatient Medications on File Prior to Visit Medication Sig Dispense Refill acetaminophen (TYLENOL) 500 MG tablet Take 1 tablet by mouth every 6 (six) hours as needed calcium carbonate (CALCIUM 500 ORAL) Take by mouth once daily cholecalciferol, vitamin D3, (VITAMIN D3 ORAL) Take by mouth once daily hydroCHLOROthiazide (HYDRODIURIL) 25 MG tablet Take 1 tablet (25 mg total) by mouth once daily 90 tablet 1 levothyroxine (SYNTHROID) 100 MCG tablet Take 1 tablet (100 mcg total) by mouth once daily 90 tablet 1 omeprazole  (PRILOSEC) 40 MG DR capsule Take 1 capsule (40 mg total) by mouth once daily 90 capsule 1 rosuvastatin (CRESTOR) 5 MG tablet Take 1 tablet (5 mg total) by mouth once daily 90 tablet 1 semaglutide (WEGOVY) 0.25 mg/0.5 mL pen injector Inject 0.5 mLs (0.25 mg total) subcutaneously once a week 2 mL 1 triamcinolone 0.1 % cream Apply topically 2 (two) times daily 30 g 0 acetaminophen/diphenhydramine (TYLENOL PM ORAL) Take by mouth as needed (Patient not taking: Reported on 01/26/2024)  No current facility-administered medications on file prior to visit.  Medical / Surgical History  Past Medical History: Diagnosis Date Arthritis Chickenpox GERD (gastroesophageal reflux disease) Mononucleosis 06/28/2009 Plantar fasciitis Pseudogout Sleep apnea Thyroid disease   Past Surgical History: Procedure Laterality Date HYSTERECTOMY 2000 Left total knee arthroplasty 11/08/2004 Dr Ernest Pine EGD 07/31/2015 APPENDECTOMY Iliotibial extraarticular reconstruction in 1983 KNEE ARTHROSCOPY Lateral retinacular release to the right knee: 1983 Multiple left knee surgeries including partial ACL and MCL reconstruction in 1974 Partial osteotomy in 1992 Pes anserinus transfer in 1975 Right Knee Arthroscopy and chondroplasty: 09/16/11 Surgery for Ovarian Cyst Betweem 1970-1990   Physical Exam  Ht:167.6 cm (5\' 6" ) Wt:92.8 kg (204 lb 9.6 oz) BMI: Body mass index is 33.02 kg/m.  General/Constitutional: No apparent distress: well-nourished and well developed. Eyes: Pupils equal, round with synchronous movement. Lymphatic: No palpable adenopathy. Respiratory: Patient has good chest rise and fall with inspiration and expiration. All lung fields are clear to auscultation bilaterally. There is no Rales, rhonchi or wheezes appreciated. Cardiovascular: Upon auscultation there is a regular rate and rhythm without any murmurs, rubs, gallops or heaves appreciated. There does not appear to be any swelling down the  lower  extremities. Posterior tibial pulses appreciated bilaterally, 2+. Integumentary: No impressive skin lesions present, except as noted in detailed exam. Neuro/Psych: Normal mood and affect, oriented to person, place and time. Musculoskeletal: see exam below  Right Hip:  Upon inspection of the patient's right hip, there does not appear to be any open abrasions, gross deformities, limb length discrepancy or pelvic tilt appreciated in stance.  Pelvic tilt: Negative Limb lengths: Equal with the patient standing Soft tissue swelling: Negative Erythema: Negative Crepitance: Negative Tenderness: Greater trochanter is nontender to palpation. Mild pain is elicited by axial compression or extremes of rotation. Atrophy: No atrophy. Fair to good hip flexor and abductor strength. Range of Motion: EXT/FLEX: 0/110 ADD/ABD: 20/25 IR/ER: 10/30  Patient is neurovascularly intact extending down all dermatomes of her right lower extremity. Pedal pulses were appreciated, 2+.  Imaging right hip Imaging: A series of x-rays were ordered and interpreted of the patient's right hip. These images included AP pelvis, AP hip and oblique hip. Upon inspection of these images, there is noticeable degenerative changes in her bilateral hips. There is complete loss of acetabular joint space with invasion into the cup. There are subchondral changes appreciated on both acetabular margins as well as osteophyte formation along the superior and inferior margins of the acetabular cup and femoral heads bilaterally. Her right side appears to have slightly worse degenerative changes when compared with her left. There does not appear to be any fractures, lytic lesions or gross deformities appreciated on films.  Assesment and Plan Right hip DJD  I have recommended that Alexis Newman undergo right total hip replacement . Consents has been signed. The risks, benefits, prognosis and alternatives including but not limited to DVT, PE,  infection, neurovascular injury, failure of the procedure and death were explained to the patient and Gracianna is willing to proceed with surgery as described to [place] by myself. Plan will be for post operative admission of at least 1 midnight for pain control and PT. Aryona will be managed with DVT prophylaxis, antibiotics preoperatively for 24 hours and aggressive in patient rehab.  Pre, intra and post op interventions were discussed. Patient has good understanding  Medication Reconciliation was performed. Discussed cessation of Wegovy, HCTZ, vitamins and supplements.  A total of 50 minutes was spent reviewing patient's charts, medical reconciliation, discussing/educating the patient about surgical interventions, and answering any questions provided by the patient.  JOSHUA Kendrick Fries, PA [facility] 01/26/2024  Electronically signed by Raenette Rover, PA at 01/26/2024 6:20 PM EST

## 2024-01-31 ENCOUNTER — Encounter: Payer: Self-pay | Admitting: Orthopedic Surgery

## 2024-01-31 ENCOUNTER — Other Ambulatory Visit: Payer: Self-pay

## 2024-01-31 ENCOUNTER — Encounter
Admission: RE | Disposition: A | Discharge status: Home / Self Care | Payer: Self-pay | Source: Ambulatory Visit | Attending: Orthopedic Surgery

## 2024-01-31 ENCOUNTER — Ambulatory Visit: Admitting: Anesthesiology

## 2024-01-31 ENCOUNTER — Observation Stay
Admission: RE | Admit: 2024-01-31 | Discharge: 2024-02-01 | Disposition: A | Discharge status: Home / Self Care | Payer: BC Managed Care – PPO | Source: Ambulatory Visit | Attending: Orthopedic Surgery | Admitting: Orthopedic Surgery

## 2024-01-31 ENCOUNTER — Ambulatory Visit: Payer: Self-pay | Admitting: Urgent Care

## 2024-01-31 ENCOUNTER — Observation Stay

## 2024-01-31 DIAGNOSIS — M1611 Unilateral primary osteoarthritis, right hip: Principal | ICD-10-CM | POA: Insufficient documentation

## 2024-01-31 DIAGNOSIS — Z79899 Other long term (current) drug therapy: Secondary | ICD-10-CM | POA: Insufficient documentation

## 2024-01-31 DIAGNOSIS — Z01812 Encounter for preprocedural laboratory examination: Secondary | ICD-10-CM

## 2024-01-31 DIAGNOSIS — R002 Palpitations: Secondary | ICD-10-CM

## 2024-01-31 DIAGNOSIS — E282 Polycystic ovarian syndrome: Secondary | ICD-10-CM

## 2024-01-31 DIAGNOSIS — Z96641 Presence of right artificial hip joint: Secondary | ICD-10-CM

## 2024-01-31 DIAGNOSIS — Z96652 Presence of left artificial knee joint: Secondary | ICD-10-CM | POA: Diagnosis not present

## 2024-01-31 DIAGNOSIS — R7303 Prediabetes: Secondary | ICD-10-CM

## 2024-01-31 DIAGNOSIS — R5383 Other fatigue: Secondary | ICD-10-CM

## 2024-01-31 DIAGNOSIS — G4730 Sleep apnea, unspecified: Secondary | ICD-10-CM

## 2024-01-31 HISTORY — PX: TOTAL HIP ARTHROPLASTY: SHX124

## 2024-01-31 LAB — ABO/RH: ABO/RH(D): A POS

## 2024-01-31 SURGERY — ARTHROPLASTY, HIP, TOTAL,POSTERIOR APPROACH
Anesthesia: General | Site: Hip | Laterality: Right

## 2024-01-31 MED ORDER — BISACODYL 10 MG RE SUPP: 10.0000 mg | Freq: Every day | RECTAL | Status: DC | PRN

## 2024-01-31 MED ORDER — CEFAZOLIN SODIUM-DEXTROSE 2-4 GM/100ML-% IV SOLN
2.0000 g | INTRAVENOUS | Status: AC
  Administered 2024-01-31: 2 g via INTRAVENOUS

## 2024-01-31 MED ORDER — TRANEXAMIC ACID-NACL 1000-0.7 MG/100ML-% IV SOLN
INTRAVENOUS | Status: AC
Start: 2024-01-31 — End: ?
  Filled 2024-01-31: qty 100

## 2024-01-31 MED ORDER — LACTATED RINGERS IV SOLN: INTRAVENOUS | Status: DC

## 2024-01-31 MED ORDER — ORAL CARE MOUTH RINSE: 15.0000 mL | OROMUCOSAL | Status: DC | PRN

## 2024-01-31 MED ORDER — LIDOCAINE HCL (CARDIAC) PF 100 MG/5ML IV SOSY
PREFILLED_SYRINGE | INTRAVENOUS | Status: DC | PRN
  Administered 2024-01-31: 60 mg via INTRAVENOUS

## 2024-01-31 MED ORDER — FENTANYL CITRATE (PF) 100 MCG/2ML IJ SOLN
INTRAMUSCULAR | Status: DC | PRN
Start: 2024-01-31 — End: 2024-01-31
  Administered 2024-01-31 (×4): 25 ug via INTRAVENOUS

## 2024-01-31 MED ORDER — 0.9 % SODIUM CHLORIDE (POUR BTL) OPTIME
TOPICAL | Status: DC | PRN
  Administered 2024-01-31: 500 mL

## 2024-01-31 MED ORDER — ACETAMINOPHEN 10 MG/ML IV SOLN: 1000.0000 mg | Freq: Once | INTRAVENOUS | Status: DC | PRN

## 2024-01-31 MED ORDER — MIDAZOLAM HCL 2 MG/2ML IJ SOLN
INTRAMUSCULAR | Status: AC
  Filled 2024-01-31: qty 2

## 2024-01-31 MED ORDER — PROPOFOL 10 MG/ML IV BOLUS
INTRAVENOUS | Status: DC | PRN
  Administered 2024-01-31: 40 mg via INTRAVENOUS

## 2024-01-31 MED ORDER — DEXAMETHASONE SODIUM PHOSPHATE 10 MG/ML IJ SOLN
8.0000 mg | Freq: Once | INTRAMUSCULAR | Status: AC
  Administered 2024-01-31: 8 mg via INTRAVENOUS

## 2024-01-31 MED ORDER — DIPHENHYDRAMINE HCL 12.5 MG/5ML PO ELIX: 12.5000 mg | ORAL_SOLUTION | ORAL | Status: DC | PRN

## 2024-01-31 MED ORDER — BUPIVACAINE HCL (PF) 0.5 % IJ SOLN
INTRAMUSCULAR | Status: DC | PRN
  Administered 2024-01-31: 3 mL via INTRATHECAL

## 2024-01-31 MED ORDER — ACETAMINOPHEN 10 MG/ML IV SOLN
INTRAVENOUS | Status: AC
  Filled 2024-01-31: qty 100

## 2024-01-31 MED ORDER — CELECOXIB 200 MG PO CAPS
ORAL_CAPSULE | ORAL | Status: AC
  Filled 2024-01-31: qty 2

## 2024-01-31 MED ORDER — PHENOL 1.4 % MT LIQD: 1.0000 | OROMUCOSAL | Status: DC | PRN

## 2024-01-31 MED ORDER — ALUM & MAG HYDROXIDE-SIMETH 200-200-20 MG/5ML PO SUSP: 30.0000 mL | ORAL | Status: DC | PRN

## 2024-01-31 MED ORDER — METOCLOPRAMIDE HCL 10 MG PO TABS
10.0000 mg | ORAL_TABLET | Freq: Three times a day (TID) | ORAL | Status: DC
  Administered 2024-01-31 – 2024-02-01 (×3): 10 mg via ORAL
  Filled 2024-01-31 (×3): qty 1

## 2024-01-31 MED ORDER — MENTHOL 3 MG MT LOZG: 1.0000 | LOZENGE | OROMUCOSAL | Status: DC | PRN

## 2024-01-31 MED ORDER — MIDAZOLAM HCL 5 MG/5ML IJ SOLN
INTRAMUSCULAR | Status: DC | PRN
Start: 2024-01-31 — End: 2024-01-31
  Administered 2024-01-31: 2 mg via INTRAVENOUS

## 2024-01-31 MED ORDER — SURGIPHOR WOUND IRRIGATION SYSTEM - OPTIME: TOPICAL | Status: DC | PRN

## 2024-01-31 MED ORDER — LACTATED RINGERS IV SOLN: INTRAVENOUS | Status: DC | PRN

## 2024-01-31 MED ORDER — PROPOFOL 1000 MG/100ML IV EMUL
INTRAVENOUS | Status: AC
  Filled 2024-01-31: qty 100

## 2024-01-31 MED ORDER — ORAL CARE MOUTH RINSE: 15.0000 mL | Freq: Once | OROMUCOSAL | Status: AC

## 2024-01-31 MED ORDER — CELECOXIB 200 MG PO CAPS
200.0000 mg | ORAL_CAPSULE | Freq: Two times a day (BID) | ORAL | Status: DC
  Administered 2024-01-31 – 2024-02-01 (×2): 200 mg via ORAL
  Filled 2024-01-31 (×2): qty 1

## 2024-01-31 MED ORDER — LEVOTHYROXINE SODIUM 25 MCG PO TABS
100.0000 ug | ORAL_TABLET | Freq: Every day | ORAL | Status: DC
Start: 2024-02-01 — End: 2024-02-01
  Administered 2024-02-01: 100 ug via ORAL
  Filled 2024-01-31: qty 1

## 2024-01-31 MED ORDER — TRAMADOL HCL 50 MG PO TABS
50.0000 mg | ORAL_TABLET | ORAL | Status: DC | PRN
Start: 2024-01-31 — End: 2024-02-01
  Administered 2024-01-31 – 2024-02-01 (×2): 50 mg via ORAL
  Filled 2024-01-31 (×2): qty 1

## 2024-01-31 MED ORDER — FENTANYL CITRATE (PF) 100 MCG/2ML IJ SOLN: 25.0000 ug | INTRAMUSCULAR | Status: DC | PRN

## 2024-01-31 MED ORDER — MAGNESIUM HYDROXIDE 400 MG/5ML PO SUSP
30.0000 mL | Freq: Every day | ORAL | Status: DC
  Filled 2024-01-31: qty 30

## 2024-01-31 MED ORDER — ACETAMINOPHEN 325 MG PO TABS: 325.0000 mg | ORAL_TABLET | Freq: Four times a day (QID) | ORAL | Status: DC | PRN

## 2024-01-31 MED ORDER — GABAPENTIN 300 MG PO CAPS
300.0000 mg | ORAL_CAPSULE | Freq: Once | ORAL | Status: AC
  Administered 2024-01-31: 300 mg via ORAL

## 2024-01-31 MED ORDER — ONDANSETRON HCL 4 MG/2ML IJ SOLN
INTRAMUSCULAR | Status: DC | PRN
  Administered 2024-01-31: 4 mg via INTRAVENOUS

## 2024-01-31 MED ORDER — OXYCODONE HCL 5 MG PO TABS: 5.0000 mg | ORAL_TABLET | Freq: Once | ORAL | Status: DC | PRN

## 2024-01-31 MED ORDER — CEFAZOLIN SODIUM-DEXTROSE 2-4 GM/100ML-% IV SOLN
2.0000 g | Freq: Four times a day (QID) | INTRAVENOUS | Status: AC
  Administered 2024-01-31 (×2): 2 g via INTRAVENOUS
  Filled 2024-01-31 (×2): qty 100

## 2024-01-31 MED ORDER — SODIUM CHLORIDE 0.9 % IR SOLN
Status: DC | PRN
  Administered 2024-01-31: 3000 mL

## 2024-01-31 MED ORDER — ONDANSETRON HCL 4 MG/2ML IJ SOLN
INTRAMUSCULAR | Status: AC
  Filled 2024-01-31: qty 2

## 2024-01-31 MED ORDER — FERROUS SULFATE 325 (65 FE) MG PO TABS
325.0000 mg | ORAL_TABLET | Freq: Two times a day (BID) | ORAL | Status: DC
  Administered 2024-01-31 – 2024-02-01 (×2): 325 mg via ORAL
  Filled 2024-01-31 (×2): qty 1

## 2024-01-31 MED ORDER — CEFAZOLIN SODIUM-DEXTROSE 2-4 GM/100ML-% IV SOLN
INTRAVENOUS | Status: AC
  Filled 2024-01-31: qty 100

## 2024-01-31 MED ORDER — ACETAMINOPHEN 10 MG/ML IV SOLN
1000.0000 mg | Freq: Four times a day (QID) | INTRAVENOUS | Status: AC
  Administered 2024-01-31 – 2024-02-01 (×4): 1000 mg via INTRAVENOUS
  Filled 2024-01-31 (×4): qty 100

## 2024-01-31 MED ORDER — TRANEXAMIC ACID-NACL 1000-0.7 MG/100ML-% IV SOLN
1000.0000 mg | Freq: Once | INTRAVENOUS | Status: AC
  Administered 2024-01-31: 1000 mg via INTRAVENOUS

## 2024-01-31 MED ORDER — ASPIRIN 81 MG PO CHEW
81.0000 mg | CHEWABLE_TABLET | Freq: Two times a day (BID) | ORAL | Status: DC
  Administered 2024-01-31 – 2024-02-01 (×2): 81 mg via ORAL
  Filled 2024-01-31 (×2): qty 1

## 2024-01-31 MED ORDER — SENNOSIDES-DOCUSATE SODIUM 8.6-50 MG PO TABS
1.0000 | ORAL_TABLET | Freq: Two times a day (BID) | ORAL | Status: DC
  Administered 2024-01-31 – 2024-02-01 (×2): 1 via ORAL
  Filled 2024-01-31 (×2): qty 1

## 2024-01-31 MED ORDER — OXYCODONE HCL 5 MG PO TABS: 10.0000 mg | ORAL_TABLET | ORAL | Status: DC | PRN

## 2024-01-31 MED ORDER — ROSUVASTATIN CALCIUM 5 MG PO TABS
5.0000 mg | ORAL_TABLET | Freq: Every day | ORAL | Status: DC
  Administered 2024-02-01: 5 mg via ORAL
  Filled 2024-01-31: qty 1

## 2024-01-31 MED ORDER — GABAPENTIN 300 MG PO CAPS
ORAL_CAPSULE | ORAL | Status: AC
  Filled 2024-01-31: qty 1

## 2024-01-31 MED ORDER — HYDROMORPHONE HCL 1 MG/ML IJ SOLN: 0.5000 mg | INTRAMUSCULAR | Status: DC | PRN

## 2024-01-31 MED ORDER — LIDOCAINE HCL (PF) 2 % IJ SOLN
INTRAMUSCULAR | Status: AC
  Filled 2024-01-31: qty 5

## 2024-01-31 MED ORDER — HYDROCHLOROTHIAZIDE 25 MG PO TABS
25.0000 mg | ORAL_TABLET | Freq: Every day | ORAL | Status: DC
  Administered 2024-01-31 – 2024-02-01 (×2): 25 mg via ORAL
  Filled 2024-01-31 (×2): qty 1

## 2024-01-31 MED ORDER — FLEET ENEMA RE ENEM: 1.0000 | ENEMA | Freq: Once | RECTAL | Status: DC | PRN

## 2024-01-31 MED ORDER — BUPIVACAINE HCL (PF) 0.5 % IJ SOLN
INTRAMUSCULAR | Status: AC
  Filled 2024-01-31: qty 10

## 2024-01-31 MED ORDER — CELECOXIB 200 MG PO CAPS
400.0000 mg | ORAL_CAPSULE | Freq: Once | ORAL | Status: AC
  Administered 2024-01-31: 400 mg via ORAL

## 2024-01-31 MED ORDER — ONDANSETRON HCL 4 MG/2ML IJ SOLN
4.0000 mg | Freq: Four times a day (QID) | INTRAMUSCULAR | Status: DC | PRN
  Administered 2024-02-01: 4 mg via INTRAVENOUS
  Filled 2024-01-31: qty 2

## 2024-01-31 MED ORDER — SODIUM CHLORIDE 0.9 % IV SOLN: INTRAVENOUS | Status: DC

## 2024-01-31 MED ORDER — DEXAMETHASONE SODIUM PHOSPHATE 10 MG/ML IJ SOLN
INTRAMUSCULAR | Status: AC
  Filled 2024-01-31: qty 1

## 2024-01-31 MED ORDER — CHLORHEXIDINE GLUCONATE 0.12 % MT SOLN
OROMUCOSAL | Status: AC
  Filled 2024-01-31: qty 15

## 2024-01-31 MED ORDER — FENTANYL CITRATE (PF) 100 MCG/2ML IJ SOLN
INTRAMUSCULAR | Status: AC
  Filled 2024-01-31: qty 2

## 2024-01-31 MED ORDER — ONDANSETRON HCL 4 MG PO TABS: 4.0000 mg | ORAL_TABLET | Freq: Four times a day (QID) | ORAL | Status: DC | PRN

## 2024-01-31 MED ORDER — PROPOFOL 500 MG/50ML IV EMUL
INTRAVENOUS | Status: DC | PRN
  Administered 2024-01-31: 125 ug/kg/min via INTRAVENOUS

## 2024-01-31 MED ORDER — EPHEDRINE SULFATE-NACL 50-0.9 MG/10ML-% IV SOSY
PREFILLED_SYRINGE | INTRAVENOUS | Status: DC | PRN
  Administered 2024-01-31 (×2): 5 mg via INTRAVENOUS

## 2024-01-31 MED ORDER — CHLORHEXIDINE GLUCONATE 0.12 % MT SOLN
15.0000 mL | Freq: Once | OROMUCOSAL | Status: AC
  Administered 2024-01-31: 15 mL via OROMUCOSAL

## 2024-01-31 MED ORDER — ONDANSETRON HCL 4 MG/2ML IJ SOLN: 4.0000 mg | Freq: Once | INTRAMUSCULAR | Status: DC | PRN

## 2024-01-31 MED ORDER — ACETAMINOPHEN 10 MG/ML IV SOLN
INTRAVENOUS | Status: DC | PRN
  Administered 2024-01-31: 1000 mg via INTRAVENOUS

## 2024-01-31 MED ORDER — CHLORHEXIDINE GLUCONATE 4 % EX SOLN
60.0000 mL | Freq: Once | CUTANEOUS | Status: DC
  Administered 2024-01-31: 4 via TOPICAL

## 2024-01-31 MED ORDER — OXYCODONE HCL 5 MG/5ML PO SOLN: 5.0000 mg | Freq: Once | ORAL | Status: DC | PRN

## 2024-01-31 MED ORDER — TRANEXAMIC ACID-NACL 1000-0.7 MG/100ML-% IV SOLN
1000.0000 mg | INTRAVENOUS | Status: AC
  Administered 2024-01-31: 1000 mg via INTRAVENOUS

## 2024-01-31 MED ORDER — PANTOPRAZOLE SODIUM 40 MG PO TBEC
40.0000 mg | DELAYED_RELEASE_TABLET | Freq: Two times a day (BID) | ORAL | Status: DC
  Administered 2024-01-31 – 2024-02-01 (×2): 40 mg via ORAL
  Filled 2024-01-31 (×2): qty 1

## 2024-01-31 MED ORDER — OXYCODONE HCL 5 MG PO TABS
5.0000 mg | ORAL_TABLET | ORAL | Status: DC | PRN
  Administered 2024-01-31: 5 mg via ORAL
  Filled 2024-01-31: qty 1

## 2024-01-31 SURGICAL SUPPLY — 47 items
BLADE CLIPPER SURG (BLADE) IMPLANT
BLADE SAW 90X25X1.19 OSCILLAT (BLADE) ×1 IMPLANT
BRUSH SCRUB EZ PLAIN DRY (MISCELLANEOUS) ×1 IMPLANT
CUP ACETBLR 52 OD 100 SERIES (Hips) IMPLANT
DRAPE INCISE IOBAN 66X60 STRL (DRAPES) ×1 IMPLANT
DRAPE SHEET LG 3/4 BI-LAMINATE (DRAPES) ×1 IMPLANT
DRSG AQUACEL AG ADV 3.5X14 (GAUZE/BANDAGES/DRESSINGS) ×1 IMPLANT
DRSG MEPILEX SACRM 8.7X9.8 (GAUZE/BANDAGES/DRESSINGS) ×1 IMPLANT
DRSG TEGADERM 4X4.75 (GAUZE/BANDAGES/DRESSINGS) ×1 IMPLANT
DURAPREP 26ML APPLICATOR (WOUND CARE) ×2 IMPLANT
ELECT CAUTERY BLADE 6.4 (BLADE) ×1 IMPLANT
ELECT REM PT RETURN 9FT ADLT (ELECTROSURGICAL) ×1 IMPLANT
ELECTRODE REM PT RTRN 9FT ADLT (ELECTROSURGICAL) ×1 IMPLANT
EVACUATOR 1/8 PVC DRAIN (DRAIN) ×1 IMPLANT
GLOVE BIOGEL M STRL SZ7.5 (GLOVE) ×6 IMPLANT
GLOVE SURG UNDER POLY LF SZ8 (GLOVE) ×2 IMPLANT
GOWN STRL REUS W/ TWL LRG LVL3 (GOWN DISPOSABLE) ×2 IMPLANT
GOWN STRL REUS W/ TWL XL LVL3 (GOWN DISPOSABLE) ×1 IMPLANT
GOWN TOGA ZIPPER T7+ PEEL AWAY (MISCELLANEOUS) ×1 IMPLANT
HANDLE YANKAUER SUCT OPEN TIP (MISCELLANEOUS) ×1 IMPLANT
HEAD M SROM 36MM PLUS 1.5 (Hips) IMPLANT
HOLDER FOLEY CATH W/STRAP (MISCELLANEOUS) ×1 IMPLANT
HOOD PEEL AWAY T7 (MISCELLANEOUS) ×1 IMPLANT
IV NS IRRIG 3000ML ARTHROMATIC (IV SOLUTION) ×1 IMPLANT
KIT PEG BOARD PINK (KITS) ×1 IMPLANT
KIT TURNOVER KIT A (KITS) ×1 IMPLANT
LINER NEUTRAL 52X36MM PLUS 4 (Liner) IMPLANT
MANIFOLD NEPTUNE II (INSTRUMENTS) ×2 IMPLANT
NS IRRIG 500ML POUR BTL (IV SOLUTION) ×1 IMPLANT
PACK HIP PROSTHESIS (MISCELLANEOUS) ×1 IMPLANT
PIN STEIN THRED 5/32 (Pin) ×1 IMPLANT
PULSAVAC PLUS IRRIG FAN TIP (DISPOSABLE) ×1 IMPLANT
SOLUTION IRRIG SURGIPHOR (IV SOLUTION) ×1 IMPLANT
SPONGE DRAIN TRACH 4X4 STRL 2S (GAUZE/BANDAGES/DRESSINGS) ×1 IMPLANT
SROM M HEAD 36MM PLUS 1.5 (Hips) ×1 IMPLANT
STAPLER SKIN PROX 35W (STAPLE) ×1 IMPLANT
STEM FEM ACTIS STD SZ4 (Stem) IMPLANT
SUT ETHIBOND #5 BRAIDED 30INL (SUTURE) ×1 IMPLANT
SUT VIC AB 0 CT1 36 (SUTURE) ×2 IMPLANT
SUT VIC AB 1 CT1 36 (SUTURE) ×2 IMPLANT
SUT VIC AB 2-0 CT1 TAPERPNT 27 (SUTURE) ×1 IMPLANT
TAPE CLOTH 3X10 WHT NS LF (GAUZE/BANDAGES/DRESSINGS) ×1 IMPLANT
TIP FAN IRRIG PULSAVAC PLUS (DISPOSABLE) ×1 IMPLANT
TOWEL OR 17X26 4PK STRL BLUE (TOWEL DISPOSABLE) IMPLANT
TRAP FLUID SMOKE EVACUATOR (MISCELLANEOUS) ×1 IMPLANT
TRAY FOLEY MTR SLVR 16FR STAT (SET/KITS/TRAYS/PACK) ×1 IMPLANT
WATER STERILE IRR 1000ML POUR (IV SOLUTION) ×1 IMPLANT

## 2024-01-31 NOTE — Anesthesia Procedure Notes (Signed)
 Spinal  Patient location during procedure: OR Start time: 01/31/2024 8:44 AM End time: 01/31/2024 8:44 AM Reason for block: surgical anesthesia Staffing Performed: anesthesiologist  Anesthesiologist: Reed Breech, MD Performed by: Monico Hoar, CRNA Authorized by: Reed Breech, MD   Preanesthetic Checklist Completed: patient identified, IV checked, site marked, risks and benefits discussed, surgical consent, monitors and equipment checked, pre-op evaluation and timeout performed Spinal Block Patient position: sitting Prep: Betadine Patient monitoring: heart rate, continuous pulse ox, blood pressure and cardiac monitor Approach: right paramedian Location: L4-5 Injection technique: single-shot Needle Needle type: Whitacre and Introducer  Needle gauge: 22 G Needle length: 9 cm Assessment Sensory level: T6 Events: CSF return Additional Notes Negative paresthesia. Negative blood return. Positive free-flowing CSF. Expiration date of kit checked and confirmed. Patient tolerated procedure well, without complications.

## 2024-01-31 NOTE — Progress Notes (Signed)
 Patient is not able to walk the distance required to go the bathroom, or he/she is unable to safely negotiate stairs required to access the bathroom.  A 3in1 BSC will alleviate this problem   Amenda Duclos P. Angie Fava M.D.

## 2024-01-31 NOTE — Interval H&P Note (Signed)
 History and Physical Interval Note:  01/31/2024 7:41 AM  Alexis Newman  has presented today for surgery, with the diagnosis of PRIMARY OSTEOARTHRITIS OF RIGHT HIP.  The various methods of treatment have been discussed with the patient and family. After consideration of risks, benefits and other options for treatment, the patient has consented to  Procedure(s): ARTHROPLASTY, HIP, TOTAL,POSTERIOR APPROACH (Right) as a surgical intervention.  The patient's history has been reviewed, patient examined, no change in status, stable for surgery.  I have reviewed the patient's chart and labs.  Questions were answered to the patient's satisfaction.     Tacuma Graffam P Amauria Younts

## 2024-01-31 NOTE — Op Note (Signed)
 OPERATIVE NOTE  DATE OF SURGERY:  01/31/2024  PATIENT NAME:  Alexis Newman   DOB: [birth year]  MRN: [id number]  PRE-OPERATIVE DIAGNOSIS: Degenerative arthrosis of the right hip, primary  POST-OPERATIVE DIAGNOSIS:  Same  PROCEDURE:  Right total hip arthroplasty  SURGEON:  Jena Gauss. M.D.  ASSISTANT:  Gean Birchwood, PA-C (present and scrubbed throughout the case, critical for assistance with exposure, retraction, instrumentation, and closure)  ANESTHESIA: spinal  ESTIMATED BLOOD LOSS: 100 mL  FLUIDS REPLACED: 700 mL of crystalloid  DRAINS: 2 medium Hemovac drains  IMPLANTS UTILIZED: DePuy size 4 standard offset Actis femoral stem, 52 mm OD Pinnacle 100 acetabular component, +4 mm neutral Pinnacle Altrx polyethylene insert, and a 36 mm M-SPEC +1.5 mm hip ball  INDICATIONS FOR SURGERY: Alexis Newman is a 68 y.o. year old female with a long history of progressive hip and groin  pain. X-rays demonstrated severe degenerative changes. The patient had not seen any significant improvement despite conservative nonsurgical intervention. After discussion of the risks and benefits of surgical intervention, the patient expressed understanding of the risks benefits and agree with plans for total hip arthroplasty.   The risks, benefits, and alternatives were discussed at length including but not limited to the risks of infection, bleeding, nerve injury, stiffness, blood clots, the need for revision surgery, limb length inequality, dislocation, cardiopulmonary complications, among others, and they were willing to proceed.  PROCEDURE IN DETAIL: The patient was brought into the operating room and, after adequate spinal anesthesia was achieved, the patient was placed in a left lateral decubitus position. Axillary roll was placed and all bony prominences were well-padded. The patient's right hip was cleaned and prepped with alcohol and DuraPrep and draped in the usual sterile fashion. A "timeout" was  performed as per usual protocol. A lateral curvilinear incision was made gently curving towards the posterior superior iliac spine. The IT band was incised in line with the skin incision and the fibers of the gluteus maximus were split in line. The piriformis tendon was identified, skeletonized, and incised at its insertion to the proximal femur and reflected posteriorly. A T type posterior capsulotomy was performed. Prior to dislocation of the femoral head, a threaded Steinmann pin was inserted through a separate stab incision into the pelvis superior to the acetabulum and bent in the form of a stylus so as to assess limb length and hip offset throughout the procedure. The femoral head was then dislocated posteriorly. Inspection of the femoral head demonstrated severe degenerative changes with full-thickness loss of articular cartilage. The femoral neck cut was performed using an oscillating saw. The anterior capsule was elevated off of the femoral neck using a periosteal elevator. Attention was then directed to the acetabulum. The remnant of the labrum was excised using electrocautery. Inspection of the acetabulum also demonstrated significant degenerative changes. The acetabulum was reamed in sequential fashion up to a 51 mm diameter. Good punctate bleeding bone was encountered. A 52 mm Pinnacle 100 acetabular component was positioned and impacted into place. Good scratch fit was appreciated. A +4 mm neutral polyethylene trial was inserted.  Attention was then directed to the proximal femur.  Femoral broaches were inserted in a sequential fashion up to a size 4 broach. Calcar region was planed and a trial reduction was performed using a standard offset neck and a 36 mm hip ball with a +1.5 mm neck length. Good equalization of limb lengths and hip offset was appreciated and excellent stability was noted both anteriorly and  posteriorly. Trial components were removed. The acetabular shell was irrigated with  copious amounts of normal saline with antibiotic solution and suctioned dry. A +4 mm neutral Pinnacle Altrx polyethylene insert was positioned and impacted into place. Next, a size 4 standard offset Actis femoral stem was positioned and impacted into place. Excellent scratch fit was appreciated. A trial reduction was again performed with a 36 mm hip ball with a +1.5 mm neck length. Again, good equalization of limb lengths was appreciated and excellent stability appreciated both anteriorly and posteriorly. The hip was then dislocated and the trial hip ball was removed. The Morse taper was cleaned and dried. A 36 mm M-SPEC hip ball with a +1.5 mm neck length was placed on the trunnion and impacted into place. The hip was then reduced and placed through range of motion. Excellent stability was appreciated both anteriorly and posteriorly.  The wound was irrigated with copious amounts of normal saline followed by 450 ml of Surgiphor and suctioned dry. Good hemostasis was appreciated. The posterior capsulotomy was repaired using #5 Ethibond. Piriformis tendon was reapproximated to the undersurface of the gluteus medius tendon using #5 Ethibond. The IT band was reapproximated using interrupted sutures of #1 Vicryl. Subcutaneous tissue was approximated using first #0 Vicryl followed by #2-0 Vicryl. The skin was closed with skin staples.  The patient tolerated the procedure well and was transported to the recovery room in stable condition.   Jena Gauss., M.D.

## 2024-01-31 NOTE — Anesthesia Preprocedure Evaluation (Addendum)
 Anesthesia Evaluation  Patient identified by MRN, date of birth, ID band Patient awake    Reviewed: Allergy & Precautions, NPO status , Patient's Chart, lab work & pertinent test results  History of Anesthesia Complications Negative for: history of anesthetic complications  Airway Mallampati: I   Neck ROM: Full    Dental no notable dental hx.    Pulmonary sleep apnea    Pulmonary exam normal breath sounds clear to auscultation       Cardiovascular hypertension, Normal cardiovascular exam Rhythm:Regular Rate:Normal  ECG 01/23/24: normal   Neuro/Psych negative neurological ROS     GI/Hepatic ,GERD  ,,  Endo/Other  Hypothyroidism  Obesity; prediabetes   Renal/GU negative Renal ROS     Musculoskeletal  (+) Arthritis ,  Polymyalgia rheumatica   Abdominal   Peds  Hematology negative hematology ROS (+)   Anesthesia Other Findings Last dose of Semaglutide 01/20/24  Reproductive/Obstetrics                             Anesthesia Physical Anesthesia Plan  ASA: 2  Anesthesia Plan: General and Spinal   Post-op Pain Management:    Induction: Intravenous  PONV Risk Score and Plan: 3 and Propofol infusion, TIVA, Treatment may vary due to age or medical condition and Ondansetron  Airway Management Planned: Natural Airway and Nasal Cannula  Additional Equipment:   Intra-op Plan:   Post-operative Plan:   Informed Consent: I have reviewed the patients History and Physical, chart, labs and discussed the procedure including the risks, benefits and alternatives for the proposed anesthesia with the patient or authorized representative who has indicated his/her understanding and acceptance.       Plan Discussed with: CRNA  Anesthesia Plan Comments: (Plan for spinal and GA with natural airway, LMA/GETA backup.  Patient consented for risks of anesthesia including but not limited to:  - adverse  reactions to medications - damage to eyes, teeth, lips or other oral mucosa - nerve damage due to positioning  - sore throat or hoarseness - headache, bleeding, infection, nerve damage 2/2 spinal - damage to heart, brain, nerves, lungs, other parts of body or loss of life  Informed patient about role of CRNA in peri- and intra-operative care.  Patient voiced understanding.)        Anesthesia Quick Evaluation

## 2024-01-31 NOTE — Progress Notes (Signed)
 Subjective: 1 Day Post-Op Procedure(s) (LRB): ARTHROPLASTY, HIP, TOTAL,POSTERIOR APPROACH (Right) Patient reports pain as mild.   Patient seen in rounds with Dr. Ernest Pine. Patient is well, and has had no acute complaints or problems. Denies any CP, SOB, N/V, fevers or chills. Patient did has a vasovagal episode while using the bathroom last night and felt warm and clammy. States she is feeling much better today We will start therapy today.  Plan is to go Home after hospital stay.  Objective: Vital signs in last 24 hours: Temp:  [97 F (36.1 C)-97.9 F (36.6 C)] 97.9 F (36.6 C) (03/06 0735) Pulse Rate:  [53-70] 59 (03/06 0735) Resp:  [11-18] 16 (03/06 0735) BP: (91-151)/(59-90) 120/64 (03/06 0735) SpO2:  [92 %-99 %] 97 % (03/06 0735)  Intake/Output from previous day:  Intake/Output Summary (Last 24 hours) at 02/01/2024 0829 Last data filed at 02/01/2024 0740 Gross per 24 hour  Intake 1814.41 ml  Output 1440 ml  Net 374.41 ml    Intake/Output this shift: Total I/O In: -  Out: 200 [Urine:200]  Labs: No results for input(s): "HGB" in the last 72 hours. No results for input(s): "WBC", "RBC", "HCT", "PLT" in the last 72 hours. No results for input(s): "NA", "K", "CL", "CO2", "BUN", "CREATININE", "GLUCOSE", "CALCIUM" in the last 72 hours. No results for input(s): "LABPT", "INR" in the last 72 hours.  EXAM General - Patient is Alert, Appropriate, and Oriented Extremity - Neurologically intact ABD soft Neurovascular intact Sensation intact distally Intact pulses distally Dorsiflexion/Plantar flexion intact No cellulitis present Compartment soft Dressing - dressing C/D/I and no drainage Motor Function - intact, moving foot and toes well on exam. JP Drain pulled without difficulty. Intact  Past Medical History:  Diagnosis Date   Allergy    Arthritis    Chickenpox    Essential hypertension    GERD (gastroesophageal reflux disease)    Hypothyroidism    Mononucleosis     Polymyalgia rheumatica syndrome (HCC)    Pseudogout    Pure hypercholesterolemia    Sleep apnea    Thyroid disease    Varicella without complication 04/08/2015    Assessment/Plan: 1 Day Post-Op Procedure(s) (LRB): ARTHROPLASTY, HIP, TOTAL,POSTERIOR APPROACH (Right) Principal Problem:   Hx of total hip arthroplasty, right  Estimated body mass index is 33.09 kg/m as calculated from the following:   Height as of this encounter: 5\' 6"  (1.676 m).   Weight as of this encounter: 93 kg. Advance diet Up with therapy  Patient will continue to work with physical therapy to pass postoperative PT protocols, ROM and strengthening  Hip Preacutions  Discussed with the patient continuing to utilize ice over the bandage  Patient will wear TED hose bilaterally to help prevent DVT and clot formation  Discussed the Aquacel bandage.  This bandage will stay in place 7 days postoperatively.  Can be replaced with honeycomb bandages that will be sent home with the patient  Discussed sending the patient home with tramadol and oxycodone for as needed pain management.  Patient will also be sent home with Celebrex to help with swelling and inflammation.  Patient will take an 81 mg aspirin twice daily for DVT prophylaxis  JP drain removed without difficulty, intact  Weight-Bearing as tolerated to right leg  Patient will follow-up with [facility] clinic orthopedics in 6 weeks for re-imaging and reevaluation   Rayburn Go, PA-C [facility] 02/01/2024, 8:29 AM

## 2024-01-31 NOTE — Discharge Summary (Signed)
 Physician Discharge Summary  Subjective: 1 Day Post-Op Procedure(s) (LRB): ARTHROPLASTY, HIP, TOTAL,POSTERIOR APPROACH (Right) Patient reports pain as mild.   Patient seen in rounds with Dr. Ernest Pine. Patient is well, and has had no acute complaints or problems. Denies any CP, SOB, N/V, fevers or chills. Patient did has a vasovagal episode while using the bathroom last night and felt warm and clammy. States she is feeling much better today We will start therapy today.  Plan is to go Home after hospital stay. Patient is ready to go home  Physician Discharge Summary  Patient ID: Alexis Newman MRN: [id number] DOB/AGE: 05-23-1956 68 y.o.  Admit date: 01/31/2024 Discharge date: 02/01/2024  Admission Diagnoses:  Discharge Diagnoses:  Principal Problem:   Hx of total hip arthroplasty, right   Discharged Condition: good  Hospital Course: Patient presented to the hospital on 01/31/2024 for an elective right total hip arthroplasty performed by Dr. Ernest Pine. Patient was given 1g of TXA and 2g of Ancef prior to the procedure. She tolerated the procedure well without any complications. See procedural note below for details. Postoperatively, the patient did very well. She did have one vasovagal episode while using the bathroom but did not have any falls or issues. She states that she has recovered well today and feels much better. She was able to pass PT protocols on post-op day one without any issues. JP drain was removed without any difficulty and was intact. she was able to void her bladder without any difficulty. Physical exam was unremarkable. she denies any SOB, CP, N/V, fevers or chills. Vital signs are stable. Patient is stable to discharge home.  PROCEDURE:  Right total hip arthroplasty   SURGEON:  Jena Gauss. M.D.   ASSISTANT:  Gean Birchwood, PA-C (present and scrubbed throughout the case, critical for assistance with exposure, retraction, instrumentation, and closure)   ANESTHESIA:  spinal   ESTIMATED BLOOD LOSS: 100 mL   FLUIDS REPLACED: 700 mL of crystalloid   DRAINS: 2 medium Hemovac drains   IMPLANTS UTILIZED: DePuy size 4 standard offset Actis femoral stem, 52 mm OD Pinnacle 100 acetabular component, +4 mm neutral Pinnacle Altrx polyethylene insert, and a 36 mm M-SPEC +1.5 mm hip ball  Treatments: IV fluids   Discharge Exam: Blood pressure 120/64, pulse (!) 59, temperature 97.9 F (36.6 C), temperature source Oral, resp. rate 16, height 5\' 6"  (1.676 m), weight 93 kg, SpO2 97%.   Disposition: Discharge disposition: 01-Home or Self Care     home   Allergies as of 02/01/2024   No Known Allergies      Medication List     STOP taking these medications    naproxen sodium 220 MG tablet Commonly known as: ALEVE       TAKE these medications    aspirin 81 MG chewable tablet Chew 1 tablet (81 mg total) by mouth 2 (two) times daily.   celecoxib 200 MG capsule Commonly known as: CELEBREX Take 1 capsule (200 mg total) by mouth 2 (two) times daily.   CITRACAL +D3 PO Take 1 tablet by mouth daily.   hydrochlorothiazide 25 MG tablet Commonly known as: HYDRODIURIL TAKE 1 TABLET DAILY   levothyroxine 100 MCG tablet Commonly known as: SYNTHROID Take 1 tablet (100 mcg total) by mouth daily. Please schedule office visit before any future refill.   omeprazole 40 MG capsule Commonly known as: PRILOSEC TAKE 1 CAPSULE DAILY   oxyCODONE 5 MG immediate release tablet Commonly known as: Oxy IR/ROXICODONE Take 1 tablet (5  mg total) by mouth every 4 (four) hours as needed for moderate pain (pain score 4-6) (pain score 4-6).   rosuvastatin 5 MG tablet Commonly known as: CRESTOR TAKE 1 TABLET DAILY (PLEASE SCHEDULE OFFICE VISIT BEFORE ANY FUTURE REFILL) What changed: See the new instructions.   Semaglutide-Weight Management 0.25 MG/0.5ML Soaj Inject 0.25 mg into the skin every Saturday.   traMADol 50 MG tablet Commonly known as: ULTRAM Take 1-2  tablets (50-100 mg total) by mouth every 4 (four) hours as needed for moderate pain (pain score 4-6).   triamcinolone cream 0.1 % Commonly known as: KENALOG Apply 1 Application topically 2 (two) times daily.               Durable Medical Equipment  (From admission, onward)           Start     Ordered   01/31/24 1433  DME Walker rolling  Once       Question:  Patient needs a walker to treat with the following condition  Answer:  S/P total hip arthroplasty   01/31/24 1432   01/31/24 1433  DME Bedside commode  Once       Comments: Patient is not able to walk the distance required to go the bathroom, or he/she is unable to safely negotiate stairs required to access the bathroom.  A 3in1 BSC will alleviate this problem  Question:  Patient needs a bedside commode to treat with the following condition  Answer:  S/P total hip arthroplasty   01/31/24 1432            Follow-up Information     Hooten, Illene Labrador, MD Follow up on 03/14/2024.   Specialty: Orthopedic Surgery Why: at 2:30pm Contact information: [address] [facility] [place] [address] [phone]                 Signed: Gean Birchwood 02/01/2024, 8:29 AM   Objective: Vital signs in last 24 hours: Temp:  [97 F (36.1 C)-97.9 F (36.6 C)] 97.9 F (36.6 C) (03/06 0735) Pulse Rate:  [53-70] 59 (03/06 0735) Resp:  [11-18] 16 (03/06 0735) BP: (91-151)/(59-90) 120/64 (03/06 0735) SpO2:  [92 %-99 %] 97 % (03/06 0735)  Intake/Output from previous day:  Intake/Output Summary (Last 24 hours) at 02/01/2024 0829 Last data filed at 02/01/2024 0740 Gross per 24 hour  Intake 1814.41 ml  Output 1440 ml  Net 374.41 ml    Intake/Output this shift: Total I/O In: -  Out: 200 [Urine:200]  Labs: No results for input(s): "HGB" in the last 72 hours. No results for input(s): "WBC", "RBC", "HCT", "PLT" in the last 72 hours. No results for input(s): "NA", "K", "CL", "CO2", "BUN", "CREATININE",  "GLUCOSE", "CALCIUM" in the last 72 hours. No results for input(s): "LABPT", "INR" in the last 72 hours.  EXAM: General - Patient is Alert, Appropriate, and Oriented Extremity - Neurologically intact ABD soft Neurovascular intact Sensation intact distally Intact pulses distally Dorsiflexion/Plantar flexion intact No cellulitis present Compartment soft Dressing - dressing C/D/I and no drainage Motor Function - intact, moving foot and toes well on exam. JP Drain pulled without difficulty. Intact  Assessment/Plan: 1 Day Post-Op Procedure(s) (LRB): ARTHROPLASTY, HIP, TOTAL,POSTERIOR APPROACH (Right) Procedure(s) (LRB): ARTHROPLASTY, HIP, TOTAL,POSTERIOR APPROACH (Right) Past Medical History:  Diagnosis Date   Allergy    Arthritis    Chickenpox    Essential hypertension    GERD (gastroesophageal reflux disease)    Hypothyroidism    Mononucleosis  Polymyalgia rheumatica syndrome (HCC)    Pseudogout    Pure hypercholesterolemia    Sleep apnea    Thyroid disease    Varicella without complication 04/08/2015   Principal Problem:   Hx of total hip arthroplasty, right  Estimated body mass index is 33.09 kg/m as calculated from the following:   Height as of this encounter: 5\' 6"  (1.676 m).   Weight as of this encounter: 93 kg.  Patient will continue to work with physical therapy on gait, ROM and strengthening   Hip Preacutions   Discussed with the patient continuing to utilize ice over the bandage   Patient will wear TED hose bilaterally to help prevent DVT and clot formation   Discussed the Aquacel bandage.  This bandage will stay in place 7 days postoperatively.  Can be replaced with honeycomb bandages that will be sent home with the patient   Discussed sending the patient home with tramadol and oxycodone for as needed pain management.  Patient will also be sent home with Celebrex to help with swelling and inflammation.  Patient will take an 81 mg aspirin twice daily  for DVT prophylaxis   JP drain removed without difficulty, intact   Weight-Bearing as tolerated to right leg   Patient will follow-up with [facility] orthopedics in 6 weeks for re-imaging and reevaluation  Diet - Regular diet Follow up - in 6 weeks Activity - WBAT Disposition - Home Condition Upon Discharge - Good DVT Prophylaxis - Aspirin and TED hose  Danise Edge, PA-C Orthopaedic Surgery 02/01/2024, 8:29 AM

## 2024-01-31 NOTE — Anesthesia Postprocedure Evaluation (Signed)
 Anesthesia Post Note  Patient: Alexis Newman  Procedure(s) Performed: ARTHROPLASTY, HIP, TOTAL,POSTERIOR APPROACH (Right: Hip)  Patient location during evaluation: PACU Anesthesia Type: Spinal Level of consciousness: oriented and awake and alert Pain management: pain level controlled Vital Signs Assessment: post-procedure vital signs reviewed and stable Respiratory status: spontaneous breathing, respiratory function stable and patient connected to nasal cannula oxygen Cardiovascular status: blood pressure returned to baseline and stable Postop Assessment: no headache, no backache and no apparent nausea or vomiting Anesthetic complications: no   No notable events documented.   Last Vitals:  Vitals:   01/31/24 1200 01/31/24 1215  BP: 118/82 116/67  Pulse: 66 64  Resp: 14 17  Temp:    SpO2: 99% 95%    Last Pain:  Vitals:   01/31/24 1200  TempSrc:   PainSc: 0-No pain                 Reed Breech

## 2024-01-31 NOTE — Plan of Care (Signed)
   Problem: Pain Management: Goal: Pain level will decrease with appropriate interventions Outcome: Progressing   Problem: Skin Integrity: Goal: Will show signs of wound healing Outcome: Progressing

## 2024-01-31 NOTE — Evaluation (Signed)
 Physical Therapy Evaluation Patient Details Name: Alexis Newman MRN: [id number] DOB: 11-24-1956 Today's Date: 01/31/2024  History of Present Illness  68 y/o female s/p R total hip replacement (posterior approach) on 01/31/24.  Clinical Impression  Pt pleasant and eager to work with PT, ultimately did very well with POD0 PT eval and treat.  She was able to perform exercises well and generally against light assistance.  After initial cuing she did not need assist with bed mobility or transfers and was able to ambulate ~100 ft with RW showing increased confidence and cadence with increased distance.  Pt will benefit from continued PT per total hip protocols.        If plan is discharge home, recommend the following: Assist for transportation;A little help with bathing/dressing/bathroom;Assistance with cooking/housework;Help with stairs or ramp for entrance   Can travel by private vehicle        Equipment Recommendations None recommended by PT  Recommendations for Other Services       Functional Status Assessment Patient has had a recent decline in their functional status and demonstrates the ability to make significant improvements in function in a reasonable and predictable amount of time.     Precautions / Restrictions Precautions Precautions: Posterior Hip Precaution Booklet Issued: Yes (comment) Restrictions Weight Bearing Restrictions Per Provider Order: Yes RLE Weight Bearing Per Provider Order: Weight bearing as tolerated      Mobility  Bed Mobility Overal bed mobility: Modified Independent             General bed mobility comments: cuing for precautions and sequencing but able to get to sitting w/o assist    Transfers Overall transfer level: Modified independent Equipment used: Rolling walker (2 wheels)               General transfer comment: cuing for precaution awareness, set up, sequencing but able to rise w/o assist    Ambulation/Gait Ambulation/Gait  assistance: Contact guard assist Gait Distance (Feet): 100 Feet Assistive device: Rolling walker (2 wheels)         General Gait Details: expected initial hesitancy but able to gradually increased comfort with WBing, decreased UE/AD reliance and improved to consistent walker mometum with nearly symmetical cadence.  Stairs            Wheelchair Mobility     Tilt Bed    Modified Rankin (Stroke Patients Only)       Balance Overall balance assessment: Modified Independent                                           Pertinent Vitals/Pain Pain Assessment Pain Assessment: 0-10 Pain Score: 6  Pain Location: incision site    Home Living Family/patient expects to be discharged to:: Private residence Living Arrangements: Spouse/significant other Available Help at Discharge: Available 24 hours/day Type of Home: House Home Access: Stairs to enter Entrance Stairs-Rails: Right Entrance Stairs-Number of Steps: 3   Home Layout: Able to live on main level with bedroom/bathroom Home Equipment: Agricultural consultant (2 wheels);Shower seat;Grab bars - toilet      Prior Function Prior Level of Function : Independent/Modified Independent                     Extremity/Trunk Assessment   Upper Extremity Assessment Upper Extremity Assessment: Overall WFL for tasks assessed    Lower Extremity Assessment Lower Extremity Assessment:  Overall [facility] for tasks assessed;Generalized weakness (expected post-op weakness)       Communication   Communication Communication: No apparent difficulties    Cognition Arousal: Alert Behavior During Therapy: WFL for tasks assessed/performed   PT - Cognitive impairments: No apparent impairments                         Following commands: Intact       Cueing       General Comments      Exercises Total Joint Exercises Ankle Circles/Pumps: Strengthening, 10 reps Quad Sets: Strengthening, 10 reps Gluteal  Sets: AROM, 10 reps Short Arc Quad: Strengthening, 10 reps Heel Slides: AROM, Strengthening, 10 reps (resisted leg ext) Hip ABduction/ADduction: Strengthening, 10 reps, AROM Straight Leg Raises: AROM, 5 reps   Assessment/Plan    PT Assessment Patient needs continued PT services  PT Problem List Decreased strength;Decreased range of motion;Decreased activity tolerance;Decreased balance;Decreased mobility;Decreased safety awareness;Decreased knowledge of precautions;Pain       PT Treatment Interventions DME instruction;Gait training;Stair training;Functional mobility training;Therapeutic activities;Therapeutic exercise;Balance training;Cognitive remediation;Patient/family education    PT Goals (Current goals can be found in the Care Plan section)  Acute Rehab PT Goals Patient Stated Goal: go home PT Goal Formulation: With patient Time For Goal Achievement: 02/13/24 Potential to Achieve Goals: Good    Frequency BID     Co-evaluation               AM-PAC PT "6 Clicks" Mobility  Outcome Measure Help needed turning from your back to your side while in a flat bed without using bedrails?: A Little Help needed moving from lying on your back to sitting on the side of a flat bed without using bedrails?: A Little Help needed moving to and from a bed to a chair (including a wheelchair)?: A Little Help needed standing up from a chair using your arms (e.g., wheelchair or bedside chair)?: A Little Help needed to walk in hospital room?: A Little Help needed climbing 3-5 steps with a railing? : A Little 6 Click Score: 18    End of Session Equipment Utilized During Treatment: Gait belt Activity Tolerance: Patient tolerated treatment well Patient left: with call bell/phone within reach;in chair Nurse Communication: Mobility status PT Visit Diagnosis: Muscle weakness (generalized) (M62.81);Difficulty in walking, not elsewhere classified (R26.2);Pain Pain - Right/Left: Right Pain - part  of body: Hip    Time: 4098-1191 PT Time Calculation (min) (ACUTE ONLY): 39 min   Charges:   PT Evaluation $PT Eval Low Complexity: 1 Low PT Treatments $Gait Training: 8-22 mins $Therapeutic Exercise: 8-22 mins PT General Charges $$ ACUTE PT VISIT: 1 Visit         Malachi Pro, DPT 01/31/2024, 5:49 PM

## 2024-01-31 NOTE — Transfer of Care (Signed)
 Immediate Anesthesia Transfer of Care Note  Patient: Alexis Newman  Procedure(s) Performed: ARTHROPLASTY, HIP, TOTAL,POSTERIOR APPROACH (Right: Hip)  Patient Location: PACU  Anesthesia Type:Spinal  Level of Consciousness: awake and alert   Airway & Oxygen Therapy: Patient Spontanous Breathing and Patient connected to face mask oxygen  Post-op Assessment: Report given to RN and Post -op Vital signs reviewed and stable  Post vital signs: Reviewed and stable  Last Vitals:  Vitals Value Taken Time  BP 109/70 01/31/24 1145  Temp    Pulse 66 01/31/24 1148  Resp 16 01/31/24 1148  SpO2 98 % 01/31/24 1148  Vitals shown include unfiled device data.  Last Pain:  Vitals:   01/31/24 0747  TempSrc: Oral  PainSc: 5          Complications: No notable events documented.

## 2024-02-01 ENCOUNTER — Encounter: Payer: Self-pay | Admitting: Orthopedic Surgery

## 2024-02-01 DIAGNOSIS — M1611 Unilateral primary osteoarthritis, right hip: Secondary | ICD-10-CM | POA: Diagnosis not present

## 2024-02-01 MED ORDER — TRAMADOL HCL 50 MG PO TABS
50.0000 mg | ORAL_TABLET | ORAL | 0 refills | Status: AC | PRN
Start: 2024-02-01 — End: ?

## 2024-02-01 MED ORDER — CELECOXIB 200 MG PO CAPS
200.0000 mg | ORAL_CAPSULE | Freq: Two times a day (BID) | ORAL | 1 refills | Status: AC
Start: 2024-02-01 — End: ?

## 2024-02-01 MED ORDER — OXYCODONE HCL 5 MG PO TABS: 5.0000 mg | ORAL_TABLET | ORAL | 0 refills | Status: AC | PRN

## 2024-02-01 MED ORDER — ASPIRIN 81 MG PO CHEW: 81.0000 mg | CHEWABLE_TABLET | Freq: Two times a day (BID) | ORAL | Status: AC

## 2024-02-01 NOTE — Progress Notes (Addendum)
 Patient got up to walk to bathroom with NT. Patient walked to BR without issue. Voided then felt dizzy and lightheaded with nausea. Patient pale and diaphoretic. NT called for RN. NT got cool washcloth for patient and wheelchair after RN arrived to patients side. RN and NT attempted to assist patient to [facility] to get back to be, but as patient stood with our assist patient had vagal episode and RN and NT attempted to sit her as much on wheelchair as possible. Holding on the patient to ensure she does not fall, this RN called for OR assist to help. OR RN and CST arrived and helped get patient settled further back into WC and back in bed. After patient back in bed safely vital signs retaken and Dr. Allena Katz (ON call MD) was called and notified. Will continue to monitor patient. IVF restarted as well.

## 2024-02-01 NOTE — Plan of Care (Signed)

## 2024-02-01 NOTE — Progress Notes (Signed)
 Physical Therapy Treatment Patient Details Name: Alexis Newman MRN: [id number] DOB: 06/08/1956 Today's Date: 02/01/2024   History of Present Illness 68 y/o female s/p R total hip replacement (posterior approach) on 01/31/24.    PT Comments  Pt showed good effort with all tasks and was able to ambulate ~200 ft and negotiate up/down steps w/o assist.  She did have issues with some pain (had not wanted any meds all night) needing extra time between sets during exercises and some continued AAROM for initial reps of most exercises but ultimately was able to meet or exceed typical POD1 expectations.  Pt will benefit from continued PT per total hip protocols.     If plan is discharge home, recommend the following: Assist for transportation;A little help with bathing/dressing/bathroom;Assistance with cooking/housework;Help with stairs or ramp for entrance   Can travel by private vehicle        Equipment Recommendations  None recommended by PT    Recommendations for Other Services       Precautions / Restrictions Precautions Precautions: Posterior Hip Restrictions RLE Weight Bearing Per Provider Order: Weight bearing as tolerated     Mobility  Bed Mobility Overal bed mobility: Modified Independent             General bed mobility comments: cuing for precautions and sequencing but able to slowly get to sitting w/o assist    Transfers Overall transfer level: Modified independent Equipment used: Rolling walker (2 wheels)               General transfer comment: able to control rise and descent safely    Ambulation/Gait Ambulation/Gait assistance: Contact guard assist Gait Distance (Feet): 200 Feet Assistive device: Rolling walker (2 wheels)         General Gait Details: Again with initial hesitation/guarding but able to assume consistent walker momentum and near symmetrical cadence with increased cuing and time on feet   Stairs Stairs: Yes Stairs assistance:  Supervision Stair Management: One rail Right, Sideways Number of Stairs: 4 General stair comments: Educated on and then pt able to manage without phyiscal assist and only minimal cuing.  Good overall effort, slow but safe.   Wheelchair Mobility     Tilt Bed    Modified Rankin (Stroke Patients Only)       Balance Overall balance assessment: Modified Independent                                          Communication Communication Communication: No apparent difficulties  Cognition Arousal: Alert Behavior During Therapy: WFL for tasks assessed/performed   PT - Cognitive impairments: No apparent impairments                         Following commands: Intact      Cueing    Exercises Total Joint Exercises Quad Sets: Strengthening, 10 reps Short Arc Quad: Strengthening, 10 reps Heel Slides: AROM, Strengthening, 10 reps Hip ABduction/ADduction: Strengthening, 10 reps, AROM    General Comments        Pertinent Vitals/Pain Pain Assessment Pain Score: 5     Home Living                          Prior Function            PT Goals (current goals can  now be found in the care plan section) Progress towards PT goals: Progressing toward goals    Frequency    BID      PT Plan      Co-evaluation              AM-PAC PT "6 Clicks" Mobility   Outcome Measure  Help needed turning from your back to your side while in a flat bed without using bedrails?: A Little Help needed moving from lying on your back to sitting on the side of a flat bed without using bedrails?: A Little Help needed moving to and from a bed to a chair (including a wheelchair)?: A Little Help needed standing up from a chair using your arms (e.g., wheelchair or bedside chair)?: A Little Help needed to walk in hospital room?: A Little Help needed climbing 3-5 steps with a railing? : A Little 6 Click Score: 18    End of Session Equipment Utilized During  Treatment: Gait belt Activity Tolerance: Patient tolerated treatment well Patient left: with call bell/phone within reach;in chair Nurse Communication: Mobility status PT Visit Diagnosis: Muscle weakness (generalized) (M62.81);Difficulty in walking, not elsewhere classified (R26.2);Pain Pain - Right/Left: Right Pain - part of body: Hip     Time: 0832-0905 PT Time Calculation (min) (ACUTE ONLY): 33 min  Charges:    $Gait Training: 8-22 mins $Therapeutic Exercise: 8-22 mins PT General Charges $$ ACUTE PT VISIT: 1 Visit                     Malachi Pro, DPT 02/01/2024, 9:19 AM

## 2024-02-01 NOTE — Progress Notes (Signed)
 Discharge Summary for Alexis Newman  Discharge Plan: Patient will be discharged home as per the MD's order. We discussed prescriptions and follow-up appointments with the patient. The prescriptions were provided, and the medication list was explained in detail. Patient confirmed understanding of the instructions.  Skin Assessment: The patient's skin is clean, dry, and intact, with no signs of breakdown or tears. The IV catheter was removed, and the skin remains intact. The site shows no signs or symptoms of complications. A dressing and pressure were applied to the site. Patient reports no pain and has no complaints.  After-Visit Summary: An After-Visit Summary was printed and given to the patient. The following items were sent home with patient:  Two honeycomb bandages Two TED hose placed on both patient legs  The patient was escorted via wheelchair and discharged home in a private vehicle.  Nguyet Mercer D. Lawerance Bach, RN

## 2024-02-01 NOTE — Addendum Note (Signed)
 Addendum  created 02/01/24 0811 by Monico Hoar, CRNA   Flowsheet accepted

## 2024-02-01 NOTE — Progress Notes (Signed)
 Occupational Therapy Evaluation Patient Details Name: Alexis Newman MRN: [id number] DOB: 08-23-1956 Today's Date: 02/01/2024   History of Present Illness   68 y/o female s/p R total hip replacement (posterior approach) on 01/31/24.     Clinical Impressions Pt seen for OT evaluation this date, POD#1 from above surgery. Pt handed off from PT. Pt reports she was Independent/MODI in all IADLs/ADLs prior to surgery. Pt is eager to return to PLOF with less pain and improved safety and independence. Pt currently requires no physical assistance for LB dressing - MODI while competing dressing with use of a reacher. Pt amb into bathroom with CGA + RW, demonstrating good recall of hip precautions throughout. Pt completed standing grooming tasks at sink level with CGA due to pt stating feeling "flushed," HR 90bpm while standing at sink. 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 Pt instructed in self care skills, falls prevention strategies, home/routines modifications, DME/AE for LB bathing and dressing tasks, and compression stocking mgt strategies. Pt would benefit from skilled OT services self care skills and techniques to help maintain precautions with or without assistive devices to support recall and carryover prior to discharge. Do not anticipate the need for follow up OT services upon acute hospital DC. OT will follow acutely.     If plan is discharge home, recommend the following:   A little help with walking and/or transfers;A little help with bathing/dressing/bathroom;Assist for transportation;Help with stairs or ramp for entrance     Functional Status Assessment   Patient has had a recent decline in their functional status and demonstrates the ability to make significant improvements in function in a reasonable and predictable amount of time.     Equipment Recommendations   None recommended by OT     Recommendations for Other Services          Precautions/Restrictions   Precautions Precautions: Posterior Hip Precaution Booklet Issued: Yes (comment) Recall of Precautions/Restrictions: Intact Restrictions Weight Bearing Restrictions Per Provider Order: Yes RLE Weight Bearing Per Provider Order: Weight bearing as tolerated     Mobility Bed Mobility               General bed mobility comments: NT pt handoff from PT, in recliner post eval    Transfers Overall transfer level: Modified independent Equipment used: Rolling walker (2 wheels)               General transfer comment: Good carryover of hip precations throughout t/f and mobility       Balance Overall balance assessment: Modified Independent                                         ADL either performed or assessed with clinical judgement   ADL Overall ADL's : Needs assistance/impaired Eating/Feeding: Modified independent   Grooming: Wash/dry face;Wash/dry hands;Oral care;Brushing hair;Contact guard assist (Due to pt stating feeling "flushed")           Upper Body Dressing : Modified independent   Lower Body Dressing: Modified independent;Adhering to hip precautions;Sit to/from stand;With adaptive equipment (with use of reacher)   Toilet Transfer: Contact guard assist;Ambulation;Adhering to hip precautions   Toileting- Clothing Manipulation and Hygiene: Modified independent       Functional mobility during ADLs: Contact guard assist;Rolling walker (2 wheels) (Pt stated feeling "flushed," HR 90bpm during standing grooming tasks) General ADL Comments: Pt completed all  ADL tasks with no physical assistance required     Vision Baseline Vision/History: 1 Wears glasses              Pertinent Vitals/Pain Pain Assessment Pain Assessment: 0-10 Pain Score: 3  Pain Location: incision site Pain Descriptors / Indicators: Sore Pain Intervention(s): Limited activity within patient's tolerance, Monitored during session,  Repositioned     Extremity/Trunk Assessment Upper Extremity Assessment Upper Extremity Assessment: Overall WFL for tasks assessed   Lower Extremity Assessment Lower Extremity Assessment: Defer to PT evaluation;Overall [facility] for tasks assessed   Cervical / Trunk Assessment Cervical / Trunk Assessment: Normal   Communication Communication Communication: No apparent difficulties   Cognition Arousal: Alert Behavior During Therapy: WFL for tasks assessed/performed Cognition: No apparent impairments             OT - Cognition Comments: A/O x4                 Following commands: Intact       Cueing  General Comments   Cueing Techniques: Verbal cues  Pt incision site d/c/I pre/post session   Exercises Exercises: Other exercises Other Exercises Other Exercises: Edu: role of OT, safe ADL/IADL completion with adherence to hip precations, safe use of AD   Shoulder Instructions     Home Living Family/patient expects to be discharged to:: Private residence Living Arrangements: Spouse/significant other Available Help at Discharge: Available 24 hours/day Type of Home: House Home Access: Stairs to enter [employer] of Steps: 3 Entrance Stairs-Rails: Right Home Layout: Able to live on main level with bedroom/bathroom     Bathroom Shower/Tub: Arts development officer Toilet: Handicapped height     Home Equipment: Agricultural consultant (2 wheels);Shower seat;Grab bars - toilet   Additional Comments: Planning to purchase reacher/sock aid asap for use at d/c      Prior Functioning/Environment Prior Level of Function : Independent/Modified Independent             Mobility Comments: No DME use PTA ADLs Comments: Indep/MOD I all IADL/ADL, 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)    OT Problem List: Decreased knowledge of use of DME or AE;Decreased activity tolerance;Impaired balance (sitting and/or standing)   OT Treatment/Interventions:  Self-care/ADL training;Energy conservation;DME and/or AE instruction      OT Goals(Current goals can be found in the care plan section)   Acute Rehab OT Goals Patient Stated Goal: to get better OT Goal Formulation: With patient Time For Goal Achievement: 02/15/24 Potential to Achieve Goals: Good ADL Goals Pt Will Perform Lower Body Dressing: with modified independence;with adaptive equipment;sit to/from stand Pt Will Transfer to Toilet: with modified independence;ambulating Pt Will Perform Toileting - Clothing Manipulation and hygiene: with modified independence   OT Frequency:  Min 1X/week    Co-evaluation              AM-PAC OT "6 Clicks" Daily Activity     Outcome Measure Help from another person eating meals?: None Help from another person taking care of personal grooming?: A Little Help from another person toileting, which includes using toliet, bedpan, or urinal?: None Help from another person bathing (including washing, rinsing, drying)?: A Little Help from another person to put on and taking off regular upper body clothing?: None Help from another person to put on and taking off regular lower body clothing?: A Little 6 Click Score: 21   End of Session Equipment Utilized During Treatment: Gait belt;Rolling walker (2 wheels)  Activity Tolerance: Patient  tolerated treatment well Patient left: in chair;with call bell/phone within reach  OT Visit Diagnosis: Muscle weakness (generalized) (M62.81);Unsteadiness on feet (R26.81);Other abnormalities of gait and mobility (R26.89)                Time: 1610-9604 OT Time Calculation (min): 28 min Charges:  OT General Charges $OT Visit: 1 Visit OT Evaluation $OT Eval Low Complexity: 1 Low OT Treatments $Self Care/Home Management : 8-22 mins  Glenard Haring M.S. OTR/L  02/01/24, 10:41 AM

## 2024-08-20 ENCOUNTER — Ambulatory Visit: Admitting: Anesthesiology

## 2024-08-20 ENCOUNTER — Other Ambulatory Visit: Payer: Self-pay

## 2024-08-20 ENCOUNTER — Encounter: Payer: Self-pay | Admitting: Internal Medicine

## 2024-08-20 ENCOUNTER — Encounter
Admission: RE | Disposition: A | Discharge status: Home / Self Care | Payer: Self-pay | Source: Home / Self Care | Attending: Internal Medicine

## 2024-08-20 ENCOUNTER — Ambulatory Visit
Admission: RE | Admit: 2024-08-20 | Discharge: 2024-08-20 | Disposition: A | Discharge status: Home / Self Care | Attending: Internal Medicine | Admitting: Internal Medicine

## 2024-08-20 DIAGNOSIS — I10 Essential (primary) hypertension: Secondary | ICD-10-CM | POA: Insufficient documentation

## 2024-08-20 DIAGNOSIS — Z79890 Hormone replacement therapy (postmenopausal): Secondary | ICD-10-CM | POA: Insufficient documentation

## 2024-08-20 DIAGNOSIS — E039 Hypothyroidism, unspecified: Secondary | ICD-10-CM | POA: Diagnosis not present

## 2024-08-20 DIAGNOSIS — M353 Polymyalgia rheumatica: Secondary | ICD-10-CM | POA: Insufficient documentation

## 2024-08-20 DIAGNOSIS — Z1211 Encounter for screening for malignant neoplasm of colon: Secondary | ICD-10-CM | POA: Diagnosis present

## 2024-08-20 DIAGNOSIS — M1990 Unspecified osteoarthritis, unspecified site: Secondary | ICD-10-CM | POA: Diagnosis not present

## 2024-08-20 DIAGNOSIS — G4730 Sleep apnea, unspecified: Secondary | ICD-10-CM | POA: Insufficient documentation

## 2024-08-20 HISTORY — PX: COLONOSCOPY: SHX5424

## 2024-08-20 SURGERY — COLONOSCOPY
Anesthesia: General

## 2024-08-20 MED ORDER — SODIUM CHLORIDE 0.9 % IV SOLN: INTRAVENOUS | Status: DC

## 2024-08-20 MED ORDER — PROPOFOL 500 MG/50ML IV EMUL
INTRAVENOUS | Status: DC | PRN
  Administered 2024-08-20: 150 ug/kg/min via INTRAVENOUS

## 2024-08-20 MED ORDER — PROPOFOL 10 MG/ML IV BOLUS
INTRAVENOUS | Status: DC | PRN
  Administered 2024-08-20: 90 mg via INTRAVENOUS
  Administered 2024-08-20: 30 mg via INTRAVENOUS

## 2024-08-20 MED ORDER — LIDOCAINE HCL (CARDIAC) PF 100 MG/5ML IV SOSY
PREFILLED_SYRINGE | INTRAVENOUS | Status: DC | PRN
  Administered 2024-08-20: 40 mg via INTRAVENOUS

## 2024-08-20 MED ORDER — DEXMEDETOMIDINE HCL IN NACL 80 MCG/20ML IV SOLN
INTRAVENOUS | Status: DC | PRN
Start: 2024-08-20 — End: 2024-08-20
  Administered 2024-08-20: 4 ug via INTRAVENOUS

## 2024-08-20 NOTE — Transfer of Care (Signed)
 Immediate Anesthesia Transfer of Care Note  Patient: Alexis Newman  Procedure(s) Performed: Procedure(s): COLONOSCOPY (N/A)  Patient Location: PACU and Endoscopy Unit  Anesthesia Type:General  Level of Consciousness: sedated  Airway & Oxygen Therapy: Patient Spontanous Breathing and Patient connected to nasal cannula oxygen  Post-op Assessment: Report given to RN and Post -op Vital signs reviewed and stable  Post vital signs: Reviewed and stable  Last Vitals:  Vitals:   08/20/24 1003 08/20/24 1112  BP: (!) 177/92 105/71  Pulse: 84   Resp: 14 18  Temp: (!) 35.8 C   SpO2: 99% 97%    Complications: No apparent anesthesia complications

## 2024-08-20 NOTE — Interval H&P Note (Signed)
 History and Physical Interval Note:  08/20/2024 10:47 AM  Alexis Newman  has presented today for surgery, with the diagnosis of Colon cancer screening [Z12.11].  The various methods of treatment have been discussed with the patient and family. After consideration of risks, benefits and other options for treatment, the patient has consented to  Procedure(s): COLONOSCOPY (N/A) as a surgical intervention.  The patient's history has been reviewed, patient examined, no change in status, stable for surgery.  I have reviewed the patient's chart and labs.  Questions were answered to the patient's satisfaction.     [place], Victoriano Campion

## 2024-08-20 NOTE — Op Note (Signed)
 [facility] Gastroenterology Patient Name: Alexis Newman Procedure Date: 08/20/2024 10:49 AM MRN: [id number] Account #: [id number] Date of Birth: 1956-11-23 Admit Type: Outpatient Age: 68 Room: [facility] ENDO ROOM 1 Gender: Female Note Status: Finalized Instrument Name: Colon Scope [id number] Procedure:             Colonoscopy Indications:           Screening for colorectal malignant neoplasm Providers:             Sara Keys K. Aundria MD, MD Referring MD:          Johnnette Bail Medicines:             Propofol  per Anesthesia Complications:         No immediate complications. Procedure:             Pre-Anesthesia Assessment:                        - The risks and benefits of the procedure and the                         sedation options and risks were discussed with the                         patient. All questions were answered and informed                         consent was obtained.                        - Patient identification and proposed procedure were                         verified prior to the procedure by the nurse. The                         procedure was verified in the procedure room.                        - ASA Grade Assessment: II - A patient with mild                         systemic disease.                        - After reviewing the risks and benefits, the patient                         was deemed in satisfactory condition to undergo the                         procedure.                        After obtaining informed consent, the colonoscope was                         passed under direct vision. Throughout the procedure,                         the patient's blood  pressure, pulse, and oxygen                         saturations were monitored continuously. The                         Colonoscope was introduced through the anus and                         advanced to the the cecum, identified by appendiceal                         orifice and  ileocecal valve. The colonoscopy was                         performed without difficulty. The patient tolerated                         the procedure well. The quality of the bowel                         preparation was good. The ileocecal valve, appendiceal                         orifice, and rectum were photographed. Findings:      The perianal and digital rectal examinations were normal. Pertinent       negatives include normal sphincter tone and no palpable rectal lesions.      The entire examined colon appeared normal on direct and retroflexion       views. Impression:            - The entire examined colon is normal on direct and                         retroflexion views.                        - No specimens collected. Recommendation:        - Patient has a contact number available for                         emergencies. The signs and symptoms of potential                         delayed complications were discussed with the patient.                         Return to normal activities tomorrow. Written                         discharge instructions were provided to the patient.                        - Resume previous diet.                        - Continue present medications.                        - Repeat colonoscopy in 10 years  for screening                         purposes.                        - Return to GI office PRN.                        - The findings and recommendations were discussed with                         the patient. Procedure Code(s):     --- Professional ---                        H9878, Colorectal cancer screening; colonoscopy on                         individual not meeting criteria for high risk Diagnosis Code(s):     --- Professional ---                        Z12.11, Encounter for screening for malignant neoplasm                         of colon CPT copyright 2022 [employer]. All rights reserved. The codes documented in  this report are preliminary and upon coder review may  be revised to meet current compliance requirements. Ladell MARLA Boss MD, MD 08/20/2024 11:10:59 AM This report has been signed electronically. Number of Addenda: 0 Note Initiated On: 08/20/2024 10:49 AM Scope Withdrawal Time: 0 hours 6 minutes 34 seconds  Total Procedure Duration: 0 hours 10 minutes 23 seconds  Estimated Blood Loss:  Estimated blood loss: none.      [facility]

## 2024-08-20 NOTE — Anesthesia Preprocedure Evaluation (Signed)
 Anesthesia Evaluation  Patient identified by MRN, date of birth, ID band Patient awake    Reviewed: Allergy & Precautions, NPO status , Patient's Chart, lab work & pertinent test results  History of Anesthesia Complications Negative for: history of anesthetic complications  Airway Mallampati: I   Neck ROM: Full    Dental  (+) Dental Advidsory Given   Pulmonary neg shortness of breath, sleep apnea , neg COPD, neg recent URI   Pulmonary exam normal breath sounds clear to auscultation       Cardiovascular hypertension, (-) angina (-) Past MI and (-) Cardiac Stents Normal cardiovascular exam(-) dysrhythmias (-) Valvular Problems/Murmurs Rhythm:Regular Rate:Normal  ECG 01/23/24: normal   Neuro/Psych negative neurological ROS     GI/Hepatic ,GERD  ,,  Endo/Other  Hypothyroidism  Obesity; prediabetes   Renal/GU negative Renal ROS     Musculoskeletal  (+) Arthritis ,  Polymyalgia rheumatica   Abdominal   Peds  Hematology negative hematology ROS (+)   Anesthesia Other Findings Past Medical History: No date: Allergy No date: Arthritis No date: Chickenpox No date: Essential hypertension No date: GERD (gastroesophageal reflux disease) No date: Hypothyroidism No date: Mononucleosis No date: Polymyalgia rheumatica syndrome No date: Pseudogout No date: Pure hypercholesterolemia No date: Sleep apnea No date: Thyroid  disease 04/08/2015: Varicella without complication   Reproductive/Obstetrics                              Anesthesia Physical Anesthesia Plan  ASA: 2  Anesthesia Plan: General   Post-op Pain Management:    Induction: Intravenous  PONV Risk Score and Plan: 3 and Propofol  infusion, TIVA and Treatment may vary due to age or medical condition  Airway Management Planned: Natural Airway and Nasal Cannula  Additional Equipment:   Intra-op Plan:   Post-operative Plan:    Informed Consent: I have reviewed the patients History and Physical, chart, labs and discussed the procedure including the risks, benefits and alternatives for the proposed anesthesia with the patient or authorized representative who has indicated his/her understanding and acceptance.       Plan Discussed with: CRNA  Anesthesia Plan Comments: (Plan for spinal and GA with natural airway, LMA/GETA backup.  Patient consented for risks of anesthesia including but not limited to:  - adverse reactions to medications - damage to eyes, teeth, lips or other oral mucosa - nerve damage due to positioning  - sore throat or hoarseness - headache, bleeding, infection, nerve damage 2/2 spinal - damage to heart, brain, nerves, lungs, other parts of body or loss of life  Informed patient about role of CRNA in peri- and intra-operative care.  Patient voiced understanding.)         Anesthesia Quick Evaluation

## 2024-08-20 NOTE — Anesthesia Procedure Notes (Signed)
 Date/Time: 08/20/2024 10:56 AM  Performed by: Tod Handing, CRNAPre-anesthesia Checklist: Patient identified, Emergency Drugs available, Suction available and Patient being monitored Patient Re-evaluated:Patient Re-evaluated prior to induction Oxygen Delivery Method: Nasal cannula Induction Type: IV induction Dental Injury: Teeth and Oropharynx as per pre-operative assessment  Comments: Nasal cannula with etCO2 monitoring

## 2024-08-20 NOTE — H&P (Signed)
 Outpatient short stay form Pre-procedure 08/20/2024 10:46 AM Alexis Newman, M.D.  Primary Physician: Lavenia Beaver, M.D.  Reason for visit:  Colon cancer screening  History of present illness:  Patient presents for colonoscopy for colon cancer screening. The patient denies complaints of abdominal pain, significant change in bowel habits, or rectal bleeding.      Current Facility-Administered Medications:    0.9 %  sodium chloride  infusion, , Intravenous, Continuous, [place], Loxley Schmale K, MD, Last Rate: 20 mL/hr at 08/20/24 1009, New Bag at 08/20/24 1009  Medications Prior to Admission  Medication Sig Dispense Refill Last Dose/Taking   amoxicillin (AMOXIL) 500 MG tablet Take 500 mg by mouth 2 (two) times daily.   08/20/2024 Morning   celecoxib  (CELEBREX ) 200 MG capsule Take 1 capsule (200 mg total) by mouth 2 (two) times daily. 60 capsule 1 08/19/2024   hydrochlorothiazide  (HYDRODIURIL ) 25 MG tablet TAKE 1 TABLET DAILY (Patient taking differently: Take 25 mg by mouth daily.) 90 tablet 3 08/19/2024   levothyroxine  (SYNTHROID ) 100 MCG tablet Take 1 tablet (100 mcg total) by mouth daily. Please schedule office visit before any future refill. 90 tablet 0 08/19/2024   Multiple Vitamins-Minerals (CITRACAL +D3 PO) Take 1 tablet by mouth daily.   Past Week   omeprazole  (PRILOSEC) 40 MG capsule TAKE 1 CAPSULE DAILY 90 capsule 3 08/19/2024   rosuvastatin  (CRESTOR ) 5 MG tablet TAKE 1 TABLET DAILY (PLEASE SCHEDULE OFFICE VISIT BEFORE ANY FUTURE REFILL) (Patient taking differently: Take 5 mg by mouth daily.) 30 tablet 5 08/19/2024   aspirin  81 MG chewable tablet Chew 1 tablet (81 mg total) by mouth 2 (two) times daily. (Patient not taking: Reported on 08/20/2024)   Not Taking   oxyCODONE  (OXY IR/ROXICODONE ) 5 MG immediate release tablet Take 1 tablet (5 mg total) by mouth every 4 (four) hours as needed for moderate pain (pain score 4-6) (pain score 4-6). 30 tablet 0    Semaglutide-Weight Management 0.25  MG/0.5ML SOAJ Inject 0.25 mg into the skin every Saturday.   08/07/2024   traMADol  (ULTRAM ) 50 MG tablet Take 1-2 tablets (50-100 mg total) by mouth every 4 (four) hours as needed for moderate pain (pain score 4-6). 30 tablet 0    triamcinolone  cream (KENALOG ) 0.1 % Apply 1 Application topically 2 (two) times daily.        No Known Allergies   Past Medical History:  Diagnosis Date   Allergy    Arthritis    Chickenpox    Essential hypertension    GERD (gastroesophageal reflux disease)    Hypothyroidism    Mononucleosis    Polymyalgia rheumatica syndrome    Pseudogout    Pure hypercholesterolemia    Sleep apnea    Thyroid  disease    Varicella without complication 04/08/2015    Review of systems:  Otherwise negative.    Physical Exam  Gen: Alert, oriented. Appears stated age.  HEENT: [place]/AT. PERRLA. Lungs: CTA, no wheezes. CV: RR nl S1, S2. Abd: soft, benign, no masses. BS+ Ext: No edema. Pulses 2+    Planned procedures: Proceed with colonoscopy. The patient understands the nature of the planned procedure, indications, risks, alternatives and potential complications including but not limited to bleeding, infection, perforation, damage to internal organs and possible oversedation/side effects from anesthesia. The patient agrees and gives consent to proceed.  Please refer to procedure notes for findings, recommendations and patient disposition/instructions.     Haniyyah Sakuma K. Newman, M.D. Gastroenterology 08/20/2024  10:46 AM

## 2024-08-21 ENCOUNTER — Encounter: Payer: Self-pay | Admitting: Internal Medicine

## 2024-08-28 NOTE — Anesthesia Postprocedure Evaluation (Signed)
 Anesthesia Post Note  Patient: Alexis Newman  Procedure(s) Performed: COLONOSCOPY  Patient location during evaluation: Endoscopy Anesthesia Type: General Level of consciousness: awake and alert Pain management: pain level controlled Vital Signs Assessment: post-procedure vital signs reviewed and stable Respiratory status: spontaneous breathing, nonlabored ventilation, respiratory function stable and patient connected to nasal cannula oxygen Cardiovascular status: blood pressure returned to baseline and stable Postop Assessment: no apparent nausea or vomiting Anesthetic complications: no   No notable events documented.   Last Vitals:  Vitals:   08/20/24 1113 08/20/24 1123  BP: 105/71   Pulse: 73   Resp:    Temp: (!) 36.1 C   SpO2: 97% 98%    Last Pain:  Vitals:   08/21/24 0705  TempSrc:   PainSc: 0-No pain                 Prentice Murphy
